# Patient Record
Sex: Female | Born: 2000 | Race: Black or African American | Hispanic: No | Smoking: Never smoker
Health system: Southern US, Community
[De-identification: ages and names within clinical notes are randomized; demographics above are authoritative.]

## PROBLEM LIST (undated history)

## (undated) DIAGNOSIS — J45909 Unspecified asthma, uncomplicated: Secondary | ICD-10-CM

## (undated) DIAGNOSIS — N390 Urinary tract infection, site not specified: Secondary | ICD-10-CM

## (undated) DIAGNOSIS — A749 Chlamydial infection, unspecified: Secondary | ICD-10-CM

## (undated) DIAGNOSIS — L80 Vitiligo: Secondary | ICD-10-CM

## (undated) HISTORY — PX: NO PAST SURGERIES: SHX2092

## (undated) HISTORY — PX: WISDOM TOOTH EXTRACTION: SHX21

## (undated) HISTORY — DX: Chlamydial infection, unspecified: A74.9

---
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
## 2019-02-08 ENCOUNTER — Telehealth: Payer: Self-pay

## 2019-02-08 NOTE — Telephone Encounter (Signed)
The patient stated she would like the birth control removed. The 3- year mark is 7/3. She stated she is experiencing issues with the birth control and does not want to complete another method at this time.

## 2019-02-28 ENCOUNTER — Ambulatory Visit: Payer: Medicaid Other | Admitting: Obstetrics & Gynecology

## 2019-03-24 ENCOUNTER — Ambulatory Visit: Payer: Medicaid Other | Admitting: Obstetrics & Gynecology

## 2019-03-24 ENCOUNTER — Other Ambulatory Visit: Payer: Self-pay

## 2019-03-24 VITALS — BP 120/75 | HR 78 | Temp 98.2°F | Wt 108.0 lb

## 2019-03-24 DIAGNOSIS — Z3046 Encounter for surveillance of implantable subdermal contraceptive: Secondary | ICD-10-CM

## 2019-03-24 DIAGNOSIS — Z30011 Encounter for initial prescription of contraceptive pills: Secondary | ICD-10-CM | POA: Diagnosis not present

## 2019-03-24 MED ORDER — NORGESTIM-ETH ESTRAD TRIPHASIC 0.18/0.215/0.25 MG-35 MCG PO TABS: 1.0000 | ORAL_TABLET | Freq: Every day | ORAL | 11 refills | Status: DC

## 2019-03-24 NOTE — Progress Notes (Signed)
GYNECOLOGY OFFICE PROCEDURE NOTE Rachael Gilbert is a 18 y.o. G0P0000 here for Nexplanon removal.  No other gynecologic concerns. She is concerned that she lost weight during the years the device was in place   Nexplanon Removal Patient identified, informed consent performed, consent signed.   Appropriate time out taken. Nexplanon site identified.  Area prepped in usual sterile fashon. One ml of 1% lidocaine was used to anesthetize the area at the distal end of the implant. A small stab incision was made right beside the implant on the distal portion.  The Nexplanon rod was grasped using hemostats and removed without difficulty.  There was minimal blood loss. There were no complications.  3 ml of 1% lidocaine was injected around the incision for post-procedure analgesia.  Steri-strips were applied over the small incision.  A pressure bandage was applied to reduce any bruising.  The patient tolerated the procedure well and was given post procedure instructions.  Patient is planning to use OCP for contraception.  Tri Sprintec rx sent to Rx       Woodroe Mode, MD Attending Obstetrician & Gynecologist, [place] [facility]  03/24/2019

## 2019-03-24 NOTE — Patient Instructions (Signed)

## 2019-03-29 ENCOUNTER — Encounter: Payer: Self-pay | Admitting: *Deleted

## 2019-08-14 ENCOUNTER — Other Ambulatory Visit: Payer: Self-pay

## 2019-08-14 ENCOUNTER — Inpatient Hospital Stay (HOSPITAL_COMMUNITY)
Admission: AD | Admit: 2019-08-14 | Discharge: 2019-08-14 | Disposition: A | Discharge status: Home / Self Care | Payer: Medicaid Other | Attending: Family Medicine | Admitting: Family Medicine

## 2019-08-14 ENCOUNTER — Encounter (HOSPITAL_COMMUNITY): Payer: Self-pay | Admitting: Family Medicine

## 2019-08-14 ENCOUNTER — Inpatient Hospital Stay (HOSPITAL_COMMUNITY): Payer: Medicaid Other

## 2019-08-14 DIAGNOSIS — B9689 Other specified bacterial agents as the cause of diseases classified elsewhere: Secondary | ICD-10-CM | POA: Diagnosis not present

## 2019-08-14 DIAGNOSIS — J45909 Unspecified asthma, uncomplicated: Secondary | ICD-10-CM | POA: Insufficient documentation

## 2019-08-14 DIAGNOSIS — N760 Acute vaginitis: Secondary | ICD-10-CM | POA: Diagnosis not present

## 2019-08-14 DIAGNOSIS — O99511 Diseases of the respiratory system complicating pregnancy, first trimester: Secondary | ICD-10-CM | POA: Insufficient documentation

## 2019-08-14 DIAGNOSIS — Z3A01 Less than 8 weeks gestation of pregnancy: Secondary | ICD-10-CM | POA: Diagnosis not present

## 2019-08-14 DIAGNOSIS — Z331 Pregnant state, incidental: Secondary | ICD-10-CM

## 2019-08-14 DIAGNOSIS — O26891 Other specified pregnancy related conditions, first trimester: Secondary | ICD-10-CM

## 2019-08-14 DIAGNOSIS — O98811 Other maternal infectious and parasitic diseases complicating pregnancy, first trimester: Secondary | ICD-10-CM | POA: Insufficient documentation

## 2019-08-14 DIAGNOSIS — O210 Mild hyperemesis gravidarum: Secondary | ICD-10-CM | POA: Diagnosis present

## 2019-08-14 LAB — POCT PREGNANCY, URINE: Preg Test, Ur: POSITIVE — AB

## 2019-08-14 LAB — CBC
HCT: 37.2 % (ref 36.0–46.0)
Hemoglobin: 12.8 g/dL (ref 12.0–15.0)
MCH: 30.1 pg (ref 26.0–34.0)
MCHC: 34.4 g/dL (ref 30.0–36.0)
MCV: 87.5 fL (ref 80.0–100.0)
Platelets: 297 10*3/uL (ref 150–400)
RBC: 4.25 MIL/uL (ref 3.87–5.11)
RDW: 12.3 % (ref 11.5–15.5)
WBC: 7.9 10*3/uL (ref 4.0–10.5)
nRBC: 0 % (ref 0.0–0.2)

## 2019-08-14 LAB — URINALYSIS, ROUTINE W REFLEX MICROSCOPIC
Bilirubin Urine: NEGATIVE
Glucose, UA: NEGATIVE mg/dL
Hgb urine dipstick: NEGATIVE
Ketones, ur: NEGATIVE mg/dL
Nitrite: NEGATIVE
Protein, ur: NEGATIVE mg/dL
Specific Gravity, Urine: 1.029 (ref 1.005–1.030)
pH: 6 (ref 5.0–8.0)

## 2019-08-14 LAB — WET PREP, GENITAL
Trich, Wet Prep: NONE SEEN
Yeast Wet Prep HPF POC: NONE SEEN

## 2019-08-14 LAB — HIV ANTIBODY (ROUTINE TESTING W REFLEX): HIV Screen 4th Generation wRfx: NONREACTIVE

## 2019-08-14 LAB — ABO/RH: ABO/RH(D): B POS

## 2019-08-14 LAB — HCG, QUANTITATIVE, PREGNANCY: hCG, Beta Chain, Quant, S: 6357 m[IU]/mL — ABNORMAL HIGH (ref <5)

## 2019-08-14 MED ORDER — ACETAMINOPHEN 500 MG PO TABS
1000.0000 mg | ORAL_TABLET | Freq: Once | ORAL | Status: AC
  Administered 2019-08-14: 1000 mg via ORAL
  Filled 2019-08-14: qty 2

## 2019-08-14 MED ORDER — METRONIDAZOLE 500 MG PO TABS: 500.0000 mg | ORAL_TABLET | Freq: Two times a day (BID) | ORAL | 0 refills | Status: AC

## 2019-08-14 MED ORDER — METOCLOPRAMIDE HCL 10 MG PO TABS: 10.0000 mg | ORAL_TABLET | Freq: Four times a day (QID) | ORAL | 0 refills | Status: DC

## 2019-08-14 NOTE — ED Notes (Signed)
Report called to MAU, pt states she is pregnant with pelvic pain.

## 2019-08-14 NOTE — MAU Note (Signed)
Pt reports constant lower abdominal pain that started a few days ago. Took ibuprofen the other day and took Tylenol earlier today, both which helped. Had +upt on Christmas Day. Pt denies bleeding or discharge. LMP: around Thanksgiving.

## 2019-08-14 NOTE — Discharge Instructions (Signed)

## 2019-08-14 NOTE — MAU Provider Note (Signed)
History     CSN: [id number]  Arrival date and time: 08/14/19 4010   First Provider Initiated Contact with Patient 08/14/19 2047      Chief Complaint  Patient presents with  . Possible Pregnancy  . Abdominal Pain   HPI  Ms.  ADRENA Gilbert is a 18 y.o. year old G22P0000 female at [redacted]w[redacted]d weeks gestation who presents to MAU reporting lower abdominal pain for the past few days. She has taken Ibuprofen a few days ago and Tylenol earlier today; both helped. She reports her LMP "around Thanksgiving" and a (+) UPT on 08/12/2019.  Past Medical History:  Diagnosis Date  . Asthma   . UTI (urinary tract infection)   . Vitiligo     Past Surgical History:  Procedure Laterality Date  . WISDOM TOOTH EXTRACTION      Family History  Problem Relation Age of Onset  . Bipolar disorder Mother   . Heart disease Mother   . Hypertension Mother   . Bipolar disorder Father     Social History   Tobacco Use  . Smoking status: Never Smoker  . Smokeless tobacco: Never Used  Substance Use Topics  . Alcohol use: No  . Drug use: Not Currently    Types: Marijuana    Comment: pt stated last use was on 06/2019    Allergies: No Known Allergies  No medications prior to admission.    Review of Systems  Constitutional: Negative.   HENT: Negative.   Eyes: Negative.   Respiratory: Negative.   Cardiovascular: Negative.   Gastrointestinal: Positive for nausea.  Endocrine: Negative.   Genitourinary: Positive for pelvic pain (constant for past few days).  Musculoskeletal: Negative.   Skin: Negative.   Allergic/Immunologic: Negative.   Neurological: Negative.   Hematological: Negative.   Psychiatric/Behavioral: Negative.    Physical Exam   Blood pressure 124/69, pulse 100, temperature 98.3 F (36.8 C), temperature source Oral, resp. rate 16, last menstrual period 07/14/2019, SpO2 99 %.  Physical Exam  Nursing note and vitals reviewed. Constitutional: She is oriented to person, place,  and time. She appears well-developed and well-nourished.  HENT:  Head: Normocephalic and atraumatic.  Eyes: Pupils are equal, round, and reactive to light.  Cardiovascular: Normal rate.  Respiratory: Effort normal.  GI: Soft.  Genitourinary:    Genitourinary Comments: Uterus: non-tender, SE: cervix is smooth, pink, no lesions, small amt of thick, white, malodorous vaginal d/c -- WP, GC/CT done, closed/long/firm, no CMT or friability, mild RT adnexal tenderness    Musculoskeletal:        General: Normal range of motion.     Cervical back: Normal range of motion.  Neurological: She is alert and oriented to person, place, and time.  Skin: Skin is warm and dry.  Psychiatric: She has a normal mood and affect. Her behavior is normal. Judgment and thought content normal.    MAU Course  Procedures  MDM [facility] UPT CBC ABO/Rh HCG Wet Prep GC/CT -- pending HIV -- pending OB < 14 wks [place] with TV Tylenol 1000 mg po --- resolved pain  Results for orders placed or performed during the hospital encounter of 08/14/19 (from the past 24 hour(s))  Pregnancy, urine POC     Status: Abnormal   Collection Time: 08/14/19  8:17 PM  Result Value Ref Range   Preg Test, Ur POSITIVE (A) NEGATIVE  Urinalysis, Routine w reflex microscopic     Status: Abnormal   Collection Time: 08/14/19  8:18 PM  Result Value  Ref Range   Color, Urine YELLOW YELLOW   APPearance HAZY (A) CLEAR   Specific Gravity, Urine 1.029 1.005 - 1.030   pH 6.0 5.0 - 8.0   Glucose, UA NEGATIVE NEGATIVE mg/dL   Hgb urine dipstick NEGATIVE NEGATIVE   Bilirubin Urine NEGATIVE NEGATIVE   Ketones, ur NEGATIVE NEGATIVE mg/dL   Protein, ur NEGATIVE NEGATIVE mg/dL   Nitrite NEGATIVE NEGATIVE   Leukocytes,Ua SMALL (A) NEGATIVE   RBC / HPF 0-5 0 - 5 RBC/hpf   WBC, UA 6-10 0 - 5 WBC/hpf   Bacteria, UA RARE (A) NONE SEEN   Squamous Epithelial / LPF 0-5 0 - 5   Mucus PRESENT   Wet prep, genital     Status: Abnormal   Collection Time:  08/14/19  8:58 PM  Result Value Ref Range   Yeast Wet Prep HPF POC NONE SEEN NONE SEEN   Trich, Wet Prep NONE SEEN NONE SEEN   Clue Cells Wet Prep HPF POC PRESENT (A) NONE SEEN   WBC, Wet Prep HPF POC MANY (A) NONE SEEN   Sperm PRESENT   CBC     Status: None   Collection Time: 08/14/19  9:02 PM  Result Value Ref Range   WBC 7.9 4.0 - 10.5 K/uL   RBC 4.25 3.87 - 5.11 MIL/uL   Hemoglobin 12.8 12.0 - 15.0 g/dL   HCT 60.1 09.3 - 23.5 %   MCV 87.5 80.0 - 100.0 fL   MCH 30.1 26.0 - 34.0 pg   MCHC 34.4 30.0 - 36.0 g/dL   RDW 57.3 22.0 - 25.4 %   Platelets 297 150 - 400 K/uL   nRBC 0.0 0.0 - 0.2 %  ABO/Rh     Status: None   Collection Time: 08/14/19  9:02 PM  Result Value Ref Range   ABO/RH(D) B POS    No rh immune globuloin      NOT A RH IMMUNE GLOBULIN CANDIDATE, PT RH POSITIVE Performed at [facility], [address] [place], [place] [address]   hCG, quantitative, pregnancy     Status: Abnormal   Collection Time: 08/14/19  9:02 PM  Result Value Ref Range   hCG, Beta Chain, Quant, S 6,357 (H) <5 mIU/mL    US OB LESS THAN 14 WEEKS WITH OB TRANSVAGINAL  Result Date: 08/14/2019 CLINICAL DATA:  18 year old pregnant female with pelvic pain. LMP: 07/14/2019 corresponding to an estimated gestational age of [redacted] weeks, 3 days. EXAM: OBSTETRIC <14 WK [place] AND TRANSVAGINAL OB US TECHNIQUE: Both transabdominal and transvaginal ultrasound examinations were performed for complete evaluation of the gestation as well as the maternal uterus, adnexal regions, and pelvic cul-de-sac. Transvaginal technique was performed to assess early pregnancy. COMPARISON:  None. FINDINGS: Intrauterine gestational sac: Single intrauterine gestational sac. Yolk sac:  Seen Embryo:  Not present. Cardiac Activity: N/A Heart Rate: N/A bpm MSD: 6 mm   5 w   2 d Subchorionic hemorrhage:  None visualized. Maternal uterus/adnexae: The maternal ovaries are unremarkable. There is a corpus luteum in the right ovary. IMPRESSION:  Single intrauterine gestational sac with an estimated gestational age of [redacted] weeks, 2 days. No fetal pole identified at this time. Follow-up with ultrasound in 7-11 days, or earlier if clinically indicated, recommended. Electronically Signed   By: Elgie Collard M.D.   On: 08/14/2019 21:36     Assessment and Plan  IUP (intrauterine pregnancy), incidental  - Advised to start [facility] - Information for [facility] given per request - [facility]   Abdominal pain during pregnancy in first trimester  - Information provided on abdominal pain in pregnancy - Advised to take Tylenol  - Safe medications in pregnancy list given   Bacterial vaginosis  - Information provided on BV - Rx for Flagyl 500 mg BID x 7 days - Discussed that BV can cause lower abdominal pain/cramping in pregnancy   Morning sickness  - Rx for Reglan 10 mg po every 6 hours prn N/V - Information provided on morning sickness   - Discharge patient - Message sent to [facility] admin pool to get patient scheduled for [facility] visits - Patient verbalized an understanding of the plan of care and agrees.     Raelyn Moraolitta Theodis Kinsel, MSN, CNM 08/14/2019, 8:47 PM

## 2019-08-15 LAB — GC/CHLAMYDIA PROBE AMP (~~LOC~~) NOT AT ARMC
Chlamydia: POSITIVE — AB
Comment: NEGATIVE
Comment: NORMAL
Neisseria Gonorrhea: NEGATIVE

## 2019-08-18 ENCOUNTER — Other Ambulatory Visit: Payer: Self-pay

## 2019-08-18 ENCOUNTER — Inpatient Hospital Stay (HOSPITAL_COMMUNITY)
Admission: AD | Admit: 2019-08-18 | Discharge: 2019-08-18 | Disposition: A | Discharge status: Home / Self Care | Payer: Medicaid Other | Attending: Obstetrics & Gynecology | Admitting: Obstetrics & Gynecology

## 2019-08-18 DIAGNOSIS — A568 Sexually transmitted chlamydial infection of other sites: Secondary | ICD-10-CM

## 2019-08-18 DIAGNOSIS — Z3A01 Less than 8 weeks gestation of pregnancy: Secondary | ICD-10-CM

## 2019-08-18 DIAGNOSIS — O98311 Other infections with a predominantly sexual mode of transmission complicating pregnancy, first trimester: Secondary | ICD-10-CM

## 2019-08-18 MED ORDER — AZITHROMYCIN 500 MG PO TABS: 1000.0000 mg | ORAL_TABLET | Freq: Once | ORAL | 0 refills | Status: AC

## 2019-08-18 MED ORDER — AZITHROMYCIN 500 MG PO TABS: 1000.0000 mg | ORAL_TABLET | Freq: Once | ORAL | 0 refills | Status: DC

## 2019-08-18 NOTE — Discharge Instructions (Signed)
Expedited Partner Therapy:  °Information Sheet for Patients and Partners  °            ° °You have been offered expedited partner therapy (EPT). This information sheet contains important information and warnings you need to be aware of, so please read it carefully.  ° °Expedited Partner Therapy (EPT) is the clinical practice of treating the sexual partners of persons who receive chlamydia, gonorrhea, or trichomoniasis diagnoses by providing medications or prescriptions to the patient. Patients then provide partners with these therapies without the health-care provider having examined the partner. In other words, EPT is a convenient, fast and private way for patients to help their sexual partners get treated.  ° °Chlamydia and gonorrhea are bacterial infections you get from having sex with a person who is already infected. Trichomoniasis (or “trich”) is a very common sexually transmitted infection (STI) that is caused by infection with a protozoan parasite called Trichomonas vaginalis.  Many people with these infections don’t know it because they feel fine, but without treatment these infections can cause serious health problems, such as pelvic inflammatory disease, ectopic pregnancy, infertility and increased risk of HIV.  ° °It is important to get treated as soon as possible to protect your health, to avoid spreading these infections to others, and to prevent yourself from becoming re-infected. The good news is these infections can be easily cured with proper antibiotic medicine. The best way to take care of your self is to see a doctor or go to your local health department. If you are not able to see a doctor or other medical provider, you should take EPT.  ° ° °Recommended Medication: °EPT for Chlamydia:  Azithromycin (Zithromax) 1 gram orally in a single dose °EPT for Gonorrhea:  Cefixime (Suprax) 400 milligrams orally in a single dose PLUS azithromycin (Zithromax) 1 gram orally in a single dose °EPT for  Trichomoniasis:  Metronidazole (Flagyl) 2 grams orally in a single dose ° ° °These medicines are very safe. However, you should not take them if you have ever had an allergic reaction (like a rash) to any of these medicines: azithromycin (Zithromax), erythromycin, clarithromycin (Biaxin), metronidazole (Flagyl), tinidazole (Tindimax). If you are uncertain about whether you have an allergy, call your medical provider or pharmacist before taking this medicine. If you have a serious, long-term illness like kidney, liver or heart disease, colitis or stomach problems, or you are currently taking other prescription medication, talk to your provider before taking this medication.  ° °Women: If you have lower belly pain, pain during sex, vomiting, or a fever, do not take this medicine. Instead, you should see a medical provider to be certain you do not have pelvic inflammatory disease (PID). PID can be serious and lead to infertility, pregnancy problems or chronic pelvic pain.  ° °Pregnant Women: It is very important for you to see a doctor to get pregnancy services and pre-natal care. These antibiotics for EPT are safe for pregnant women, but you still need to see a medical provider as soon as possible. It is also important to note that Doxycycline is an alternative therapy for chlamydia, but it should not be taken by someone who is pregnant.  ° °Men: If you have pain or swelling in the testicles or a fever, do not take this medicine and see a medical provider.    ° °Men who have sex with men (MSM): MSM in [place] continue to experience high rates of syphilis and HIV. Many MSM with gonorrhea or   chlamydia could also have syphilis and/or HIV and not know it. If you are a man who has sex with other men, it is very important that you see a medical provider and are tested for HIV and syphilis. EPT is not recommended for gonorrhea for MSM.  Recommended treatment for gonorrhea for MSM is Rocephin (shot) AND azithromycin  due to decreased cure rate.  Please see your medical provider if this is the case.   ° °Along with this information sheet is a prescription for the medicine. If you receive a prescription it will be in your name and will indicate your date of birth, or it will be in the name of “Expedited Partner Therapy”.   In either case, you can have the prescription filled at a pharmacy. You will be responsible for the cost of the medicine, unless you have prescription drug coverage. In that case, you could provide your name so the pharmacy could bill your health plan.  ° °Take the medication as directed. Some people will have a mild, upset stomach, which does not last long. AVOID alcohol 24 hours after taking metronidazole (Flagyl) to reduce the possibility of a disulfiram-like reaction (severe vomiting and abdominal pain).  After taking the medicine, do not have sex for 7 days. Do not share this medicine or give it to anyone else. It is important to tell everyone you have had sex with in the last 60 days that they need to go and get tested for sexually transmitted infections.  ° °Ways to prevent these and other sexually transmitted infections (STIs):  ° °• Abstain from sex. This is the only sure way to avoid getting an STI.  °• Use barrier methods, such as condoms, consistently and correctly.  °• Limit the number of sexual partners.  °• Have regular physical exams, including testing for STIs.  ° °For more information about EPT or other issues pertaining to an STI, please contact your medical provider or the [place] [facility] at [phone] or [email or web address]   ° °

## 2019-08-18 NOTE — MAU Provider Note (Signed)
Chief Complaint: need rx/treatment   First Provider Initiated Contact with Patient 08/18/19 1418      SUBJECTIVE HPI: Rachael Gilbert is a 18 y.o. G1P0000 at [redacted]w[redacted]d by early ultrasound who presents to maternity admissions reporting she received a phone call saying she has chlamydia. She presented for abdominal cramping on 08/14/19 but reports she feels better today.  There are no other symptoms. She has not tried any treatments.   HPI  Past Medical History:  Diagnosis Date  . Asthma   . UTI (urinary tract infection)   . Vitiligo    Past Surgical History:  Procedure Laterality Date  . WISDOM TOOTH EXTRACTION     Social History   Socioeconomic History  . Marital status: Single    Spouse name: Not on file  . Number of children: Not on file  . Years of education: Not on file  . Highest education level: Not on file  Occupational History  . Not on file  Tobacco Use  . Smoking status: Never Smoker  . Smokeless tobacco: Never Used  Substance and Sexual Activity  . Alcohol use: No  . Drug use: Not Currently    Types: Marijuana    Comment: pt stated last use was on 06/2019  . Sexual activity: Yes    Birth control/protection: None  Other Topics Concern  . Not on file  Social History Narrative  . Not on file   Social Determinants of Health   Financial Resource Strain:   . Difficulty of Paying Living Expenses: Not on file  Food Insecurity:   . Worried About Programme researcher, broadcasting/film/video in the Last Year: Not on file  . Ran Out of Food in the Last Year: Not on file  Transportation Needs:   . Lack of Transportation (Medical): Not on file  . Lack of Transportation (Non-Medical): Not on file  Physical Activity:   . Days of Exercise per Week: Not on file  . Minutes of Exercise per Session: Not on file  Stress:   . Feeling of Stress : Not on file  Social Connections:   . Frequency of Communication with Friends and Family: Not on file  . Frequency of Social Gatherings with Friends and  Family: Not on file  . Attends Religious Services: Not on file  . Active Member of Clubs or Organizations: Not on file  . Attends Banker Meetings: Not on file  . Marital Status: Not on file  Intimate Partner Violence:   . Fear of Current or Ex-Partner: Not on file  . Emotionally Abused: Not on file  . Physically Abused: Not on file  . Sexually Abused: Not on file   No current facility-administered medications on file prior to encounter.   Current Outpatient Medications on File Prior to Encounter  Medication Sig Dispense Refill  . acetaminophen (TYLENOL) 500 MG tablet Take 500 mg by mouth every 6 (six) hours as needed.    Marland Kitchen erythromycin ophthalmic ointment Place a 1/2 inch ribbon of ointment into the left  lower eyelid tid x 4 days (Patient not taking: Reported on 03/24/2019) 1 g 0  . fexofenadine (ALLEGRA) 180 MG tablet Take 180 mg by mouth daily.    . metoCLOPramide (REGLAN) 10 MG tablet Take 1 tablet (10 mg total) by mouth every 6 (six) hours. 120 tablet 0  . metroNIDAZOLE (FLAGYL) 500 MG tablet Take 1 tablet (500 mg total) by mouth 2 (two) times daily for 7 days. 14 tablet 0  . triamcinolone  cream (KENALOG) 0.1 % Apply 1 application topically 2 (two) times daily. (Patient not taking: Reported on 03/24/2019) 80 g 0   No Known Allergies  ROS:  Review of Systems  Constitutional: Negative for chills, fatigue and fever.  Respiratory: Negative for shortness of breath.   Cardiovascular: Negative for chest pain.  Gastrointestinal: Negative for nausea and vomiting.  Genitourinary: Negative for difficulty urinating, dysuria, flank pain, pelvic pain, vaginal bleeding, vaginal discharge and vaginal pain.  Neurological: Negative for dizziness and headaches.  Psychiatric/Behavioral: Negative.      I have reviewed patient's Past Medical Hx, Surgical Hx, Family Hx, Social Hx, medications and allergies.   Physical Exam  No data found. Constitutional: Well-developed, well-nourished  female in no acute distress.  Cardiovascular: normal rate Respiratory: normal effort GI: Abd soft, non-tender. Pos BS x 4 MS: Extremities nontender, no edema, normal ROM Neurologic: Alert and oriented x 4.  GU: Neg CVAT.    LAB RESULTS No results found for this or any previous visit (from the past 24 hour(s)).  --/--/B POS (12/27 2102)  IMAGING  MAU Management/MDM: Orders Placed This Encounter  Procedures  . Discharge patient    Meds ordered this encounter  Medications  . azithromycin (ZITHROMAX) 500 MG tablet    Sig: Take 2 tablets (1,000 mg total) by mouth once for 1 dose.    Dispense:  2 tablet    Refill:  0    Order Specific Question:   Supervising Provider    Answer:   Woodroe Mode [[id number]]    Pt without symptoms today but presented to MAU for treatment.  Offered treatment in MAU vs Rx for pt to pick up. Pt selects Rx.  Azithromycin 1000 mg PO x 1 dose sent to pt preferred pharmacy.  Expedited partner therapy with azithromycin 1000 mg PO x 1 dose written for one female partner, Rachael Gilbert. Pt will need TOC at NOB visit.  Pt discharged with strict return precautions.  ASSESSMENT 1. Chlamydia trachomatis infection in mother during first trimester of pregnancy     PLAN Discharge home Allergies as of 08/18/2019   No Known Allergies     Medication List    TAKE these medications   acetaminophen 500 MG tablet Commonly known as: TYLENOL Take 500 mg by mouth every 6 (six) hours as needed.   azithromycin 500 MG tablet Commonly known as: Zithromax Take 2 tablets (1,000 mg total) by mouth once for 1 dose.   erythromycin ophthalmic ointment Place a 1/2 inch ribbon of ointment into the left  lower eyelid tid x 4 days   fexofenadine 180 MG tablet Commonly known as: ALLEGRA Take 180 mg by mouth daily.   metoCLOPramide 10 MG tablet Commonly known as: REGLAN Take 1 tablet (10 mg total) by mouth every 6 (six) hours.   metroNIDAZOLE 500 MG tablet Commonly known as:  FLAGYL Take 1 tablet (500 mg total) by mouth 2 (two) times daily for 7 days.   triamcinolone cream 0.1 % Commonly known as: KENALOG Apply 1 application topically 2 (two) times daily.      Follow-up [place] for [facility] Follow up.   Specialty: Obstetrics and Gynecology Why: On 09/27/19 as scheduled for new OB visit. Return to MAU with any emergencies  Contact information: [address], [id number] [place] [id number] [place] Certified Nurse-Midwife 08/18/2019  2:27 PM

## 2019-08-18 NOTE — MAU Note (Signed)
Pt had rec'd call regarding +Chlamydia results. CNM in and discussed test results and treatment options. Pt getting prescription for self and partner.

## 2019-08-19 NOTE — L&D Delivery Note (Signed)
Delivery Note At 11:11 PM a viable female was delivered via Vaginal, Spontaneous (Presentation: Right Occiput Anterior).  APGAR: 2, 7; weight  .   Placenta status: Spontaneous, Intact.  Cord: 3 vessels with the following complications: Nuchal cord x1.  Cord pH: N/A  Anesthesia: Epidural Episiotomy: None Lacerations: 1st degree;Periurethral Suture Repair: 3.0 Est. Blood Loss (mL): 100  Mom to postpartum.  Baby to Couplet care / Skin to Skin.  Sabino Dick 04/11/2020, 11:34 PM

## 2019-08-24 ENCOUNTER — Inpatient Hospital Stay (HOSPITAL_COMMUNITY): Payer: Medicaid Other

## 2019-08-24 ENCOUNTER — Other Ambulatory Visit: Payer: Self-pay

## 2019-08-24 ENCOUNTER — Encounter (HOSPITAL_COMMUNITY): Payer: Self-pay | Admitting: Obstetrics & Gynecology

## 2019-08-24 ENCOUNTER — Inpatient Hospital Stay (HOSPITAL_COMMUNITY)
Admission: AD | Admit: 2019-08-24 | Discharge: 2019-08-25 | Disposition: A | Discharge status: Home / Self Care | Payer: Medicaid Other | Source: Ambulatory Visit | Attending: Obstetrics & Gynecology | Admitting: Obstetrics & Gynecology

## 2019-08-24 DIAGNOSIS — Z3A01 Less than 8 weeks gestation of pregnancy: Secondary | ICD-10-CM | POA: Diagnosis not present

## 2019-08-24 DIAGNOSIS — O3680X0 Pregnancy with inconclusive fetal viability, not applicable or unspecified: Secondary | ICD-10-CM

## 2019-08-24 DIAGNOSIS — Z79899 Other long term (current) drug therapy: Secondary | ICD-10-CM | POA: Diagnosis not present

## 2019-08-24 DIAGNOSIS — R102 Pelvic and perineal pain: Secondary | ICD-10-CM | POA: Diagnosis not present

## 2019-08-24 DIAGNOSIS — O26891 Other specified pregnancy related conditions, first trimester: Secondary | ICD-10-CM | POA: Diagnosis not present

## 2019-08-24 DIAGNOSIS — O26899 Other specified pregnancy related conditions, unspecified trimester: Secondary | ICD-10-CM

## 2019-08-24 DIAGNOSIS — K0889 Other specified disorders of teeth and supporting structures: Secondary | ICD-10-CM

## 2019-08-24 NOTE — MAU Provider Note (Signed)
Chief Complaint: Abdominal Pain and Dental Pain   None       SUBJECTIVE HPI: Rachael Gilbert is a 19 y.o. G1P0000 at [redacted]w[redacted]d by LMP who presents to maternity admissions reporting intermittent sharp pelvic pains with a painful tooth.  Has a dental appointment next week. . She denies vaginal bleeding, vaginal itching/burning, urinary symptoms, h/a, dizziness, n/v, or fever/chills.     Abdominal Pain This is a new problem. The current episode started in the past 7 days. The onset quality is gradual. The problem occurs intermittently. The problem has been unchanged. The quality of the pain is cramping. The abdominal pain does not radiate. Pertinent negatives include no constipation, diarrhea, dysuria, fever, frequency, headaches, myalgias, nausea or vomiting. Nothing aggravates the pain. The pain is relieved by nothing. She has tried nothing for the symptoms.  Dental Pain  This is a new problem. The current episode started in the past 7 days. The problem occurs constantly. The problem has been unchanged. The pain is moderate. Pertinent negatives include no difficulty swallowing, facial pain, fever, oral bleeding or sinus pressure. She has tried nothing for the symptoms.    RN Note: States she is having sharp lower abdominal pains that occur intermittently.  Most in the lower, central part of her abdomen.  Has not taken anything for the pain. Denies VB.  Also states she is having tooth pain and has contacted a dentist-appt made for 1/14  Past Medical History:  Diagnosis Date  . Asthma   . UTI (urinary tract infection)   . Vitiligo    Past Surgical History:  Procedure Laterality Date  . WISDOM TOOTH EXTRACTION     Social History   Socioeconomic History  . Marital status: Single    Spouse name: Not on file  . Number of children: Not on file  . Years of education: Not on file  . Highest education level: Not on file  Occupational History  . Not on file  Tobacco Use  . Smoking status: Never  Smoker  . Smokeless tobacco: Never Used  Substance and Sexual Activity  . Alcohol use: No  . Drug use: Not Currently    Types: Marijuana    Comment: pt stated last use was on 06/2019  . Sexual activity: Yes    Birth control/protection: None  Other Topics Concern  . Not on file  Social History Narrative  . Not on file   Social Determinants of Health   Financial Resource Strain:   . Difficulty of Paying Living Expenses: Not on file  Food Insecurity:   . Worried About Charity fundraiser in the Last Year: Not on file  . Ran Out of Food in the Last Year: Not on file  Transportation Needs:   . Lack of Transportation (Medical): Not on file  . Lack of Transportation (Non-Medical): Not on file  Physical Activity:   . Days of Exercise per Week: Not on file  . Minutes of Exercise per Session: Not on file  Stress:   . Feeling of Stress : Not on file  Social Connections:   . Frequency of Communication with Friends and Family: Not on file  . Frequency of Social Gatherings with Friends and Family: Not on file  . Attends Religious Services: Not on file  . Active Member of Clubs or Organizations: Not on file  . Attends Archivist Meetings: Not on file  . Marital Status: Not on file  Intimate Partner Violence:   . Fear  of Current or Ex-Partner: Not on file  . Emotionally Abused: Not on file  . Physically Abused: Not on file  . Sexually Abused: Not on file   No current facility-administered medications on file prior to encounter.   Current Outpatient Medications on File Prior to Encounter  Medication Sig Dispense Refill  . acetaminophen (TYLENOL) 500 MG tablet Take 500 mg by mouth every 6 (six) hours as needed.    Marland Kitchen erythromycin ophthalmic ointment Place a 1/2 inch ribbon of ointment into the left  lower eyelid tid x 4 days (Patient not taking: Reported on 03/24/2019) 1 g 0  . fexofenadine (ALLEGRA) 180 MG tablet Take 180 mg by mouth daily.    . metoCLOPramide (REGLAN) 10 MG  tablet Take 1 tablet (10 mg total) by mouth every 6 (six) hours. 120 tablet 0  . triamcinolone cream (KENALOG) 0.1 % Apply 1 application topically 2 (two) times daily. (Patient not taking: Reported on 03/24/2019) 80 g 0   No Known Allergies  I have reviewed patient's Past Medical Hx, Surgical Hx, Family Hx, Social Hx, medications and allergies.   ROS:  Review of Systems  Constitutional: Negative for fever.  HENT: Negative for sinus pressure.   Gastrointestinal: Positive for abdominal pain. Negative for constipation, diarrhea, nausea and vomiting.  Genitourinary: Negative for dysuria and frequency.  Musculoskeletal: Negative for myalgias.  Neurological: Negative for headaches.   Review of Systems  Other systems negative   Physical Exam  Physical Exam Patient Vitals for the past 24 hrs:  BP Temp Pulse Resp Weight  08/24/19 2310 123/74 98.7 F (37.1 C) 85 17 56.8 kg   Constitutional: Well-developed, well-nourished female in no acute distress.  Oral:  Right front molar tender but not erethema or swelling Cardiovascular: normal rate Respiratory: normal effort GI: Abd soft, non-tender. Pos BS x 4 MS: Extremities nontender, no edema, normal ROM Neurologic: Alert and oriented x 4.  GU: Neg CVAT.  PELVIC EXAM: deferred, cultures done by blind swab   LAB RESULTS No results found for this or any previous visit (from the past 24 hour(s)).  --/--/B POS (12/27 2102)  IMAGING US OB Transvaginal  Result Date: 08/25/2019 CLINICAL DATA:  19 year old pregnant female with pelvic pain. LMP: 07/14/2019 corresponding to an estimated gestational age of [redacted] weeks, 6 days EXAM: TRANSVAGINAL OB ULTRASOUND TECHNIQUE: Transvaginal ultrasound was performed for complete evaluation of the gestation as well as the maternal uterus, adnexal regions, and pelvic cul-de-sac. COMPARISON:  Ultrasound dated 08/14/2019. FINDINGS: Intrauterine gestational sac: Single intrauterine gestational sac. Yolk sac:  Seen  Embryo:  Present Cardiac Activity: Detected Heart Rate: 130 bpm CRL: 9 mm   6 w 5 d                  [place] EDC: 04/13/2020 Subchorionic hemorrhage:  None visualized. Maternal uterus/adnexae: The maternal ovaries appear unremarkable. A corpus luteum is noted in the right ovary. IMPRESSION: Single live intrauterine pregnancy with an estimated gestational age of [redacted] weeks, 5 days concordant with age based on the prior ultrasound of 08/14/2019. Electronically Signed   By: Elgie Collard M.D.   On: 08/25/2019 00:01     MAU Management/MDM: Ordered usual first trimester r/o ectopic labs.   cultures done Will check baseline Ultrasound to rule out ectopic.  This pain can represent a normal pregnancy with bleeding, spontaneous abortion or even an ectopic which can be life-threatening.  The process as listed above helps to determine which of these is present.  Discussed [place]  results.   Discussed tooth pain.  No signs of abscess  May use Tylenol for pain and add Tramadol for severe pain.  ASSESSMENT 1. Pregnancy of unknown anatomic location   2. Pelvic pain affecting pregnancy   3.     Tooth pain  PLAN Discharge home  Rx Tramadol for severe tooth pain Encouraged to seek prenatal care soon  Pt stable at time of discharge. Encouraged to return here or to other Urgent Care/ED if she develops worsening of symptoms, increase in pain, fever, or other concerning symptoms.    Wynelle Bourgeois CNM, MSN Certified Nurse-Midwife 08/24/2019  11:18 PM

## 2019-08-24 NOTE — MAU Note (Signed)
States she is having sharp lower abdominal pains that occur intermittently.  Most in the lower, central part of her abdomen.  Has not taken anything for the pain. Denies VB.  Also states she is having tooth pain and has contacted a dentist-appt made for 1/14.

## 2019-08-25 LAB — URINALYSIS, ROUTINE W REFLEX MICROSCOPIC
Bilirubin Urine: NEGATIVE
Glucose, UA: NEGATIVE mg/dL
Hgb urine dipstick: NEGATIVE
Ketones, ur: NEGATIVE mg/dL
Nitrite: NEGATIVE
Protein, ur: NEGATIVE mg/dL
Specific Gravity, Urine: 1.008 (ref 1.005–1.030)
pH: 8 (ref 5.0–8.0)

## 2019-08-25 MED ORDER — TRAMADOL HCL 50 MG PO TABS: 50.0000 mg | ORAL_TABLET | Freq: Four times a day (QID) | ORAL | 0 refills | Status: DC | PRN

## 2019-08-25 NOTE — Discharge Instructions (Signed)
First Trimester of Pregnancy  The first trimester of pregnancy is from week 1 until the end of week 13 (months 1 through 3). During this time, your baby will begin to develop inside you. At 6-8 weeks, the eyes and face are formed, and the heartbeat can be seen on ultrasound. At the end of 12 weeks, all the baby's organs are formed. Prenatal care is all the medical care you receive before the birth of your baby. Make sure you get good prenatal care and follow all of your doctor's instructions. Follow these instructions at home: Medicines  Take over-the-counter and prescription medicines only as told by your doctor. Some medicines are safe and some medicines are not safe during pregnancy.  Take a prenatal vitamin that contains at least 600 micrograms (mcg) of folic acid.  If you have trouble pooping (constipation), take medicine that will make your stool soft (stool softener) if your doctor approves. Eating and drinking   Eat regular, healthy meals.  Your doctor will tell you the amount of weight gain that is right for you.  Avoid raw meat and uncooked cheese.  If you feel sick to your stomach (nauseous) or throw up (vomit): ? Eat 4 or 5 small meals a day instead of 3 large meals. ? Try eating a few soda crackers. ? Drink liquids between meals instead of during meals.  To prevent constipation: ? Eat foods that are high in fiber, like fresh fruits and vegetables, whole grains, and beans. ? Drink enough fluids to keep your pee (urine) clear or pale yellow. Activity  Exercise only as told by your doctor. Stop exercising if you have cramps or pain in your lower belly (abdomen) or low back.  Do not exercise if it is too hot, too humid, or if you are in a place of great height (high altitude).  Try to avoid standing for long periods of time. Move your legs often if you must stand in one place for a long time.  Avoid heavy lifting.  Wear low-heeled shoes. Sit and stand up  straight.  You can have sex unless your doctor tells you not to. Relieving pain and discomfort  Wear a good support bra if your breasts are sore.  Take warm water baths (sitz baths) to soothe pain or discomfort caused by hemorrhoids. Use hemorrhoid cream if your doctor says it is okay.  Rest with your legs raised if you have leg cramps or low back pain.  If you have puffy, bulging veins (varicose veins) in your legs: ? Wear support hose or compression stockings as told by your doctor. ? Raise (elevate) your feet for 15 minutes, 3-4 times a day. ? Limit salt in your food. Prenatal care  Schedule your prenatal visits by the twelfth week of pregnancy.  Write down your questions. Take them to your prenatal visits.  Keep all your prenatal visits as told by your doctor. This is important. Safety  Wear your seat belt at all times when driving.  Make a list of emergency phone numbers. The list should include numbers for family, friends, the hospital, and police and fire departments. General instructions  Ask your doctor for a referral to a local prenatal class. Begin classes no later than at the start of month 6 of your pregnancy.  Ask for help if you need counseling or if you need help with nutrition. Your doctor can give you advice or tell you where to go for help.  Do not use hot tubs, steam   rooms, or saunas.  Do not douche or use tampons or scented sanitary pads.  Do not cross your legs for long periods of time.  Avoid all herbs and alcohol. Avoid drugs that are not approved by your doctor.  Do not use any tobacco products, including cigarettes, chewing tobacco, and electronic cigarettes. If you need help quitting, ask your doctor. You may get counseling or other support to help you quit.  Avoid cat litter boxes and soil used by cats. These carry germs that can cause birth defects in the baby and can cause a loss of your baby (miscarriage) or stillbirth.  Visit your dentist.  At home, brush your teeth with a soft toothbrush. Be gentle when you floss. Contact a doctor if:  You are dizzy.  You have mild cramps or pressure in your lower belly.  You have a nagging pain in your belly area.  You continue to feel sick to your stomach, you throw up, or you have watery poop (diarrhea).  You have a bad smelling fluid coming from your vagina.  You have pain when you pee (urinate).  You have increased puffiness (swelling) in your face, hands, legs, or ankles. Get help right away if:  You have a fever.  You are leaking fluid from your vagina.  You have spotting or bleeding from your vagina.  You have very bad belly cramping or pain.  You gain or lose weight rapidly.  You throw up blood. It may look like coffee grounds.  You are around people who have [place] measles, fifth disease, or chickenpox.  You have a very bad headache.  You have shortness of breath.  You have any kind of trauma, such as from a fall or a car accident. Summary  The first trimester of pregnancy is from week 1 until the end of week 13 (months 1 through 3).  To take care of yourself and your unborn baby, you will need to eat healthy meals, take medicines only if your doctor tells you to do so, and do activities that are safe for you and your baby.  Keep all follow-up visits as told by your doctor. This is important as your doctor will have to ensure that your baby is healthy and growing well. This information is not intended to replace advice given to you by your health care provider. Make sure you discuss any questions you have with your health care provider. Document Revised: 11/25/2018 Document Reviewed: 08/12/2016 Elsevier Patient Education  [place] [employer]  Dental caries are spots of decay (cavities) in teeth. They are in the outer layer of your tooth (enamel). Treat them as soon as you can. If they are not treated, they can spread decay and lead to painful  infection. Follow these instructions at home: General instructions  Take good care of your mouth and teeth. This keeps them healthy. ? Brush your teeth 2 times a day. Use toothpaste with fluoride in it. ? Floss your teeth once a day.  If your dentist prescribed an antibiotic medicine to treat an infection, take it as told. Do not stop taking the antibiotic even if your condition gets better.  Keep all follow-up visits as told by your dentist. This is important. This includes all cleanings. Preventing dental caries   Brush your teeth every morning and night. Use fluoride toothpaste.  Get regular dental cleanings.  If you are at risk of dental caries. ? Wash your mouth with prescription mouthwash (chlorhexidine). ? Put topical fluoride  on your teeth.  Drink water with fluoride in it.  Drink water instead of sugary drinks.  Eat healthy meals and snacks. Contact a doctor if:  You have symptoms of tooth decay. Summary  Dental caries are spots of decay (cavities) in teeth. They are in the outer layer of your tooth.  Take an antibiotic to treat an infection, if told by your dentist. Do not stop taking the antibiotic even if your condition gets better.  Regular dental cleanings and brushing can help prevent dental caries. This information is not intended to replace advice given to you by your health care provider. Make sure you discuss any questions you have with your health care provider. Document Revised: 07/17/2017 Document Reviewed: 04/20/2016 Elsevier Patient Education  2020 [employer].

## 2019-09-27 ENCOUNTER — Other Ambulatory Visit: Payer: Self-pay

## 2019-09-27 ENCOUNTER — Ambulatory Visit (INDEPENDENT_AMBULATORY_CARE_PROVIDER_SITE_OTHER): Payer: Medicaid Other | Admitting: *Deleted

## 2019-09-27 DIAGNOSIS — Z3490 Encounter for supervision of normal pregnancy, unspecified, unspecified trimester: Secondary | ICD-10-CM

## 2019-09-27 DIAGNOSIS — A568 Sexually transmitted chlamydial infection of other sites: Secondary | ICD-10-CM

## 2019-09-27 DIAGNOSIS — O98311 Other infections with a predominantly sexual mode of transmission complicating pregnancy, first trimester: Secondary | ICD-10-CM

## 2019-09-27 MED ORDER — BLOOD PRESSURE KIT DEVI: 1.0000 | 0 refills | Status: DC | PRN

## 2019-09-27 MED ORDER — COMPLETENATE 29-1 MG PO CHEW: 1.0000 | CHEWABLE_TABLET | Freq: Every day | ORAL | 11 refills | Status: DC

## 2019-09-27 NOTE — Progress Notes (Signed)
I connected with  Rachael Gilbert on 09/27/19 at 10:30 AM EST by telephone and verified that I am speaking with the correct person using two identifiers.   I discussed the limitations, risks, security and privacy concerns of performing an evaluation and management service by telephone and the availability of in person appointments. I also discussed with the patient that there may be a patient responsible charge related to this service. The patient expressed understanding and agreed to proceed.  I explained I am completing her New OB Intake today. We discussed Her EDD and that it is based on  Early [place] which was 6 days different than LMP . I reviewed her allergies, meds, OB History, Medical /Surgical history, and appropriate screenings. I informed her of [facility] services.  I explained I will send her the Babys[employer]s app- app sent to her while on phone.  I explained we will send a blood pressure cuff to [facility] that will fill that prescription and we called [facility] to verify they received her prescription and confirmed she will pick  up the cuff.  I asked her to bring the blood pressure cuff with her to her first ob appointment so we can show her how to use it. Explained  then we will have her take her blood pressure weekly and enter into the app. I explained she will have some visits in office and some virtually. She already has Sports coach. I reviewed her new ob  appointment date/ time with her , our location and to wear mask, no visitors.  I explained she will have a pelvic exam, ob bloodwork, hemoglobin a1C, cbg , and  genetic testing if desired,- she does want a panorama. I scheduled an [place] at 19 weeks and gave her the appointment. She voices understanding.   Kodi Steil,RN 09/27/2019  10:33 AM

## 2019-09-27 NOTE — Patient Instructions (Signed)

## 2019-09-28 NOTE — Progress Notes (Signed)
Patient seen and assessed by nursing staff during this encounter. I have reviewed the chart and agree with the documentation and plan.  Thressa Sheller DNP, CNM  09/28/19  8:11 AM

## 2019-10-10 ENCOUNTER — Other Ambulatory Visit (HOSPITAL_COMMUNITY)
Admission: RE | Admit: 2019-10-10 | Discharge: 2019-10-10 | Disposition: A | Discharge status: Home / Self Care | Payer: Medicaid Other | Source: Ambulatory Visit | Attending: Advanced Practice Midwife | Admitting: Advanced Practice Midwife

## 2019-10-10 ENCOUNTER — Other Ambulatory Visit: Payer: Self-pay

## 2019-10-10 ENCOUNTER — Encounter: Payer: Self-pay | Admitting: Advanced Practice Midwife

## 2019-10-10 ENCOUNTER — Ambulatory Visit (INDEPENDENT_AMBULATORY_CARE_PROVIDER_SITE_OTHER): Payer: Medicaid Other | Admitting: Advanced Practice Midwife

## 2019-10-10 DIAGNOSIS — Z3492 Encounter for supervision of normal pregnancy, unspecified, second trimester: Secondary | ICD-10-CM | POA: Diagnosis not present

## 2019-10-10 DIAGNOSIS — Z3A13 13 weeks gestation of pregnancy: Secondary | ICD-10-CM

## 2019-10-10 DIAGNOSIS — Z3491 Encounter for supervision of normal pregnancy, unspecified, first trimester: Secondary | ICD-10-CM

## 2019-10-10 NOTE — Progress Notes (Signed)
Subjective:   Rachael Gilbert is a 19 y.o. G1P0000 at 60w3dby early ultrasound being seen today for her first obstetrical visit.  Her obstetrical history is significant for none . Patient does intend to breast feed. Pregnancy history fully reviewed.  Patient reports no complaints.  HISTORY: OB History  Gravida Para Term Preterm AB Living  1 0 0 0 0 0  SAB TAB Ectopic Multiple Live Births  0 0 0 0 0    # Outcome Date GA Lbr Len/2nd Weight Sex Delivery Anes PTL Lv  1 Current             Last pap smear was done NA and was NA age   Past Medical History:  Diagnosis Date  . Asthma   . Chlamydia infection   . UTI (urinary tract infection)   . Vitiligo    Past Surgical History:  Procedure Laterality Date  . NO PAST SURGERIES    . WISDOM TOOTH EXTRACTION     Family History  Problem Relation Age of Onset  . Bipolar disorder Mother   . Heart disease Mother   . Hypertension Mother   . Bipolar disorder Father    Social History   Tobacco Use  . Smoking status: Never Smoker  . Smokeless tobacco: Never Used  Substance Use Topics  . Alcohol use: No  . Drug use: Not Currently    Types: Marijuana    Comment: pt stated last use was on 06/2019   No Known Allergies Current Outpatient Medications on File Prior to Visit  Medication Sig Dispense Refill  . acetaminophen (TYLENOL) 500 MG tablet Take 500 mg by mouth every 6 (six) hours as needed.    . Blood Pressure Monitoring (BLOOD PRESSURE KIT) DEVI 1 Device by Does not apply route as needed. 1 each 0  . fexofenadine (ALLEGRA) 180 MG tablet Take 180 mg by mouth daily.    . metoCLOPramide (REGLAN) 10 MG tablet Take 10 mg by mouth 4 (four) times daily.    . prenatal vitamin w/FE, FA (NATACHEW) 29-1 MG CHEW chewable tablet Chew 1 tablet by mouth daily at 12 noon. 30 tablet 11  . erythromycin ophthalmic ointment Place a 1/2 inch ribbon of ointment into the left  lower eyelid tid x 4 days (Patient not taking: Reported on 03/24/2019) 1 g 0   . metoCLOPramide (REGLAN) 10 MG tablet Take 1 tablet (10 mg total) by mouth every 6 (six) hours. 120 tablet 0  . Prenatal Vit-Fe Fumarate-FA (PRENATAL VITAMINS PO) Take 1 tablet by mouth daily.    . traMADol (ULTRAM) 50 MG tablet Take 1 tablet (50 mg total) by mouth every 6 (six) hours as needed. (Patient not taking: Reported on 10/10/2019) 16 tablet 0  . triamcinolone cream (KENALOG) 0.1 % Apply 1 application topically 2 (two) times daily. (Patient not taking: Reported on 03/24/2019) 80 g 0   No current facility-administered medications on file prior to visit.    Review of Systems Pertinent items noted in HPI and remainder of comprehensive ROS otherwise negative.  Exam   Vitals:   10/10/19 1018  BP: 126/71  Pulse: 91  Weight: 135 lb 6.4 oz (61.4 kg)   Fetal Heart Rate (bpm): 160  Physical Exam  Constitutional: She is oriented to person, place, and time and well-developed, well-nourished, and in no distress. No distress.  HENT:  Head: Normocephalic.  Cardiovascular: Normal rate.  Pulmonary/Chest: Effort normal.  Abdominal: Soft. There is no abdominal tenderness.  Neurological: She is  alert and oriented to person, place, and time.  Skin: Skin is warm and dry.  Psychiatric: Affect normal.  Nursing note and vitals reviewed.   Assessment:   Pregnancy: G1P0000 Patient Active Problem List   Diagnosis Date Noted  . Supervision of low-risk pregnancy 09/27/2019  . Chlamydia infection   . Chlamydia trachomatis infection in mother during first trimester of pregnancy 08/18/2019  . IUP (intrauterine pregnancy), incidental 08/14/2019  . Bacterial vaginosis 08/14/2019  . Morning sickness 08/14/2019     Plan:  1. Encounter for supervision of low-risk pregnancy in second trimester - routine care    Initial labs drawn. Continue prenatal vitamins. Genetic Screening discussed, AFP and NIPS: requested. Ultrasound discussed; fetal anatomic survey: requested. Problem list reviewed and  updated. The nature of [facility] [place] with multiple MDs and other Advanced Practice Providers was explained to patient; also emphasized that residents, students are part of our team. Routine obstetric precautions reviewed. 50% of 45 min visit spent in counseling and coordination of care. Return in about 4 weeks (around 11/07/2019) for virtual visit .   Marcille Buffy DNP, CNM  10/10/19  10:25 AM

## 2019-10-10 NOTE — Addendum Note (Signed)
Addended by: Henrietta Dine on: 10/10/2019 11:19 AM   Modules accepted: Orders

## 2019-10-10 NOTE — Patient Instructions (Signed)
AREA PEDIATRIC/FAMILY PRACTICE PHYSICIANS  [place] ([address]) . [facility] o Chambliss, MD; Eniola, MD; Hale, MD; Hensel, MD; McDiarmid, MD; McIntyer, MD; Kaeya Schiffer, MD; Walden, MD o [address] [place], [place] [address] o [phone] o Mon-Fri 8:30-12:30, 1:30-5:00 o Providers come to see babies at [facility] o Accepting Medicaid . [facility] o Limited providers who accept newborns: Koirala, MD; Morrow, MD; Wolters, MD o [address], [place], [place] [address] o [phone] o Mon-Fri 8:00-5:30 o Babies seen by providers at [facility] o Does NOT accept Medicaid o Please call early in hospitalization for appointment (limited availability)  . [facility] o Mulberry, MD o [address] [place], [place] [address] o [phone] o Mon, Tue, Thur, Fri 8:30-5:00, Wed 10:00-7:00 (closed 1-2pm) o Babies seen by [facility] providers o Accepting Medicaid . [facility] o Rubin, MD o [address], [place], [place] [address] o [phone] o Mon-Fri 8:30-5:00, Sat 8:30-12:00 o Provider comes to see babies at [facility] o Accepting Medicaid o Must have been referred from current patients or contacted office prior to delivery . [facility] ([facility]) o Brown, MD; Chandler, MD; Ettefagh, MD; Grant, MD; Lester, MD; McCormick, MD; McQueen, MD; Prose, MD; Simha, MD; Stanley, MD; Stryffeler, NP; Tebben, NP o [address], [place], [place] [address] o [phone] o Mon, Tue, Thur, Fri 8:30-5:30, Wed 9:30-5:30, Sat 8:30-12:30 o Babies seen by [facility] providers o Accepting Medicaid o Only accepting infants of first-time parents or siblings of current patients o Hospital discharge coordinator will make follow-up appointment . Jack Amos o [address],  [place], [place]  [address] o [phone]   Fax - [phone] . [facility] o [address], [place], [place]  [address] o Phone - [phone]   Fax - [phone] . Shilpa Gosrani o [address], [place], [place]  [address] o [phone]  [place] ([address]) . [facility] o Bates, MD; [place], MD; Cooper, Cox, MD; MD; Davis, MD; Dovico, MD; Ettefaugh, MD; Little, MD; Lowe, MD; Keiffer, MD; Melvin, MD; Sumner, MD; Williams, MD o [address], [place], [place] [address] o [phone] o Mon-Fri 8:30-5:00 (extended evenings Mon-Thur as needed), Sat-Sun 10:00-1:00 o Providers come to see babies at [facility] o Accepting Medicaid for families of first-time babies and families with all children in the household age 3 and under. Must register with office prior to making appointment (M-F only). . [facility] o Henson, NP; Knapp, MD; Lalonde, MD; Tysinger, PA o [address] [place], [place] [address] o [phone] o Mon-Fri 8:00-5:00 o Babies seen by providers at [facility] o Does NOT accept Medicaid/Commercial Insurance Only . [facility] [place] ([facility])  o Artis, MD; Barnes, MD; Bratton, MD; Coccaro, MD; Lockett Gardner, MD; Kramer, MD; Marshall, MD; Netherton, MD; Poleto, MD; Skinner, MD o [address] [place], [place] [address] o [phone] o Mon-Fri 8:30-5:30, Sat (Oct.-Mar.) 9:00-1:00 o Babies seen by providers at [facility] o Accepting Medicaid  [place] ([address]) . [facility] o Reid, MD; Warner, MD o [address], [place],  [address] o [phone] o Mon-Fri 8:30-5:00, Sat 8:30-12:00 o Providers come to see babies at [facility] o Does NOT accept Medicaid . [facility] o Becker, PA; Hagler, MD; Scifres, PA; Sun, MD; Swayne, MD o [address],  [place], [place] [address] o [phone] o Mon-Fri 8:00-5:00 o Babies seen by providers at [facility] o Does NOT accept Medicaid o Only accepting babies of parents who are patients o Please call early in hospitalization for appointment (limited availability) . [facility] o Clark, MD; Frye, MD; Kelleher, MD; Mack, NP; Miller, MD; O'Keller, MD; Patterson, NP; Pudlo, MD; Puzio, MD; Thomas, MD; Tucker, MD; Twiselton, MD o [address], [place], [place] [address] o [phone] o Mon-Fri 8:00-5:00, Sat 9:00-12:00 o Providers come to see babies at [facility] o Does NOT accept Medicaid  [place] ([address]) . [facility] o Limited providers accepting new patients: Brake, NP; Wharton, PA o [address], [place], [place] [address] o [phone] o Mon-Fri 8:00-5:00 o Babies seen by providers at [facility] o Does NOT accept Medicaid o Only accepting babies of parents who are patients o Please call early in hospitalization for appointment (limited availability) . [facility] o Gay, MD; Quinlan, MD o [address] [place], [place] [address] o [phone] (press 1 to schedule appointment) o Mon-Fri 8:00-5:00 o Providers come to see babies at [facility] o Does NOT accept Medicaid . [facility] o Mazer, MD o [address] [place], [place] [address] o [phone] o Mon-Fri 8:30-5:00 (lunch 12:30-1:00), extended hours by appointment only Wed 5:00-6:30 o Babies seen by [facility] providers o Accepting Medicaid . [facility] o Banks, MD; Jordan, MD; Koberlein, MD o [address], [place], [place] [address] o [phone] o Mon-Fri 8:00-5:00 o Babies seen by [facility] providers o Does NOT accept Medicaid . [facility] o Parker, MD; Hunter, MD; Wallace, DO o [address] [place], [place]  [address] o [phone] o Mon-Fri 8:00-5:00 o Babies seen by [facility] providers o Does NOT accept Medicaid . [facility] o Brandon, PA; Brecken, PA; Christy, NP; Dees, MD; DeClaire, MD; DeWeese, MD; Hansen, NP; Mills, NP; Parrish, NP; Smoot, NP; Summer, MD; Vapne, MD o [address] [place], [place] [address] o [phone] o Mon-Fri 8:30-5:00, Sat 10:00-1:00 o Providers come to see babies at [facility] o Does NOT accept Medicaid o Free prenatal information session Tuesdays at 4:45pm . [facility] o Bouska, MD; Gordon, PA; Jeffery, PA; Weber, PA o [address] [place] [address] o [phone] o Mon-Fri 7:30-5:30 o Babies seen by [facility] providers . [facility] o [address], [place], [place], [place]  [address] o [phone]   Fax - [phone]  [place] ([phone]) . [facility] o [place]ese, [place] o [address] [place], [place] [address] o [phone] o Mon-Thur 8:00-6:00 o Providers come to see babies at [facility] o Accepting Medicaid . [facility] o Anderson, NP; Badger, MD; Beal, PA; Spencer, PA o [address] [place],  [address] o [phone] o Mon-Thur 7:30-7:30, Fri 7:30-4:30 o Babies seen by [facility] providers o Accepting Medicaid . [facility] o Agbuya, MD; Klett, NP; Romgoolam, MD o [address], ,  [address] o [phone] o Mon-Fri 8:30-5:00, Sat 8:30-12:00 o Providers come to see babies at [facility] o Accepting Medicaid o Must have "Meet & Greet" appointment at office prior to delivery . [facility]  ([facility] ) o McCord,   MD; Juleen China, MD; Clydene Laming, [address], [place], [place] [address] o [phone] o Mon-Wed 8:00-6:00, Thur-Fri 8:00-5:00, Sat 9:00-12:00 o Providers come to  see babies at [facility] o Does NOT accept Medicaid o Only accepting siblings of current patients . [facility] [place], [place], [place], [place]  [address] o [phone]   Fax [phone] . [facility] [address], [place], [place]  [address] o [phone]   Fax - Morton Gorman [phone] & [phone]) . Therapist, music at [place], DO; [place], [address] [place], [place] [address] o [phone] o Mon-Fri 7:00-5:00 o Babies seen by [facility] providers o Does NOT accept Medicaid . [place], MD; [place], [place]; [place], [place], [place], [place] [address] o [phone] o Mon-Fri 8:00-5:00 o Babies seen by [facility] providers o Accepting Medicaid . Lamont, MD; [place], [place]; [place], NP; [place], [place], [place], [place] [address] o [phone] o Mon-Fri 8:00-5:00 o Babies seen by providers at [facility] [place] [phone]) . [facility] [place], [place] o [address] [place], [place] [address] o [phone] o Mon-Fri 8:00-5:00 o Babies seen by [facility] providers o Does NOT accept Medicaid o Limited availability, please call early in hospitalization to schedule follow-up . [facility] Leilani Merl, PA; Maisie Fus, MD; [place], MD; [place], [place]; Jeannine Kitten, MD; [place], [place] [facility] [address], [place], [place] [address] o [phone] o Mon-Fri 8:30-5:00, Sat 9:00-12:00 o Babies seen by providers at [facility] o Accepting Medicaid o Please register online then schedule online or call office o [email or web address] . [place] [facility]  [place]) Kristian Covey, NP; Dwyane Dee, MD; Leonidas Romberg, PA o [address] [place], [place], [place] [address] o [phone] o Mon-Fri 8:00-5:00 o Babies seen by providers at [facility] o Accepting Medicaid . [place] ([facility]) Dairl Ponder, MD; Rayvon Char, NP; Melina Modena, MD o [address] [place], [place], [place] [address] o [phone] o Mon-Fri 8:00-5:30, Sat&Sun by appointment (phones open at 8:30) o Babies seen by [facility] providers o Accepting Medicaid o Must be a first-time baby or sibling of current patient . [place], [address], [place], [place]  [address] o [phone]   Fax - [phone]  [place] [phone] & [phone]) . [place], [place]; [place], [place]; Benjamine Mola, MD; [place], [place]; Harrell Lark, MD o [address] [place], [place] [address] o [phone] o Mon-Thur 8:00-7:00, Fri 8:00-5:00, Sat 8:00-12:00, Sun 9:00-12:00 o Babies seen by [facility] providers o Accepting Medicaid . [facility], MD; Ruthann Cancer, MD; [facility], MD o [address], [place], [place] [address] o [phone] o Mon-Thur 8:00-5:00 o Babies seen by providers at [facility] o Accepting Medicaid . [facility] [place], MD; Coe-Goins, MD; Amedeo Plenty, MD; Bobby Rumpf, MD; List, MD; Lavonia Drafts, MD; Ruthann Cancer, MD; Selinda Eon, MD; Audie Box, MD; Jim Like, MD; Christie Nottingham, MD; Hubbard Hartshorn, MD; Modena Nunnery, MD o [address] [place], [place]  [address] o [phone] o Mon-Fri 8:00-5:30, Sat (Oct.-Mar.) 9:00-1:00 o Babies seen by providers at [facility] o Accepting Medicaid o Must fill out new patient packet, available online at [email or web address] . [facility] ([facility] o Friddle, NP; Harris, NP; Kelly, NP; Logan, MD;  Melvin, PA; Poth, MD; Ramadoss, MD; Stanton, NP o [address], [place], [place] [address] o [phone] o Mon-Thur 8:00-5:30, Fri 8:00-5:00 o Babies seen by providers at [facility] o Accepting Medicaid  [place] ([address]) . [facility] o Dixon, PA; Dawson, MD; Pickard, MD; Tapia, PA o [address], [place], [place] [address] o [phone] o Mon-Fri 8:00-5:00 o Babies seen by providers at [facility] o Accepting Medicaid   [place] ([address]) . [facility] [place] o Masneri, DO; Meyers, MD; Nelson, PA o [address], [place], [place] [address] o [phone] o Mon-Fri 8:00-5:00 o Babies seen by providers at [facility] o Does NOT accept Medicaid o Limited appointment availability, please call early in hospitalization  . [facility] [place] o Kunedd, DO; McGowen, MD o [address], [place], [place] [address] o [phone] o Mon-Fri 8:00-5:00 o Babies seen by [facility] providers o Does NOT accept Medicaid . [facility] [place] o Cameron, MD; MacDonald, MD; Michaels, PA; Nayak, MD o [address] [place] [address], [place], [place] [address] o [phone] o Mon-Fri 8:00-5:00 o After hours clinic ([address] [place], [place] [address]) [phone] Mon-Fri 5:00-8:00, Sat 12:00-6:00, Sun 10:00-4:00 o Babies seen by [facility] providers o Accepting Medicaid . [facility] [place] o [address], [place], [place]  [address] o [phone]   Fax - [phone]  [place] ([address]) . [facility] [place] [facility] o Andy, MD o [address], [place], [place] [address] o [phone] o Mon-Fri 8:00-5:00 o Babies seen by [facility] providers o Does NOT accept Medicaid . [facility] [place] ([facility] [place]) o Eksir, MD o [address], [place], [place]  [address] o [phone] o Mon-Thur 8:00-7:00, Fri 8:00-5:00, Sat 8:00-12:00 o Babies seen by providers at [facility] o Accepting Medicaid - but does not have vaccinations in office (must be received elsewhere) o Limited availability, please call early in hospitalization  [place] ([address]) . [facility]  o Charlene Flemming, MD o [address], [place] [address] o [phone]  Fax [phone] Places to have your son circumcised:                                                                      [facility]     [phone]   $480 while you are in hospital         [facility]              [phone]   $269 by 4 wks                      [facility]                     [phone]   $  269 by 7 days [facility]                    [phone]   $269 by 4 wks [facility]             [phone]   $225 by 2 wks    These prices sometimes change but are roughly what you can expect to pay. Please call and confirm pricing.   Circumcision is considered an elective/non-medically necessary procedure. There are many reasons parents decide to have their sons circumsized. During the first year of life circumcised males have a reduced risk of urinary tract infections but after this year the rates between circumcised males and uncircumcised males are the same.  It is safe to have your son circumcised outside of the hospital and the places above perform them regularly.   Deciding about Circumcision in Baby Boys  (Up-to-date The Basics)  What is circumcision?   Circumcision is a surgery that removes the skin that covers the tip of the penis, called the "foreskin" Circumcision is usually done when a boy is between 1 and 10 days old. In the [place], circumcision is common. In some other countries, fewer boys are circumcised. Circumcision is a common tradition in some religions.  Should I have my baby boy circumcised?   There is no easy answer. Circumcision has some benefits. But it also has risks.  After talking with your doctor, you will have to decide for yourself what is right for your family.  What are the benefits of circumcision?   Circumcised boys seem to have slightly lower rates of: ?Urinary tract infections ?Swelling of the opening at the tip of the penis Circumcised men seem to have slightly lower rates of: ?Urinary tract infections ?Swelling of the opening at the tip of the penis ?Penis cancer ?HIV and other infections that you catch during sex ?Cervical cancer in the women they have sex with Even so, in the [place], the risks of these problems are small - even in boys and men who have not been circumcised. Plus, boys and men who are not circumcised can reduce these extra risks by: ?Cleaning their penis well ?Using condoms during sex  What are the risks of circumcision?  Risks include: ?Bleeding or infection from the surgery ?Damage to or amputation of the penis ?A chance that the doctor will cut off too much or not enough of the foreskin ?A chance that sex won't feel as good later in life Only about 1 out of every 200 circumcisions leads to problems. There is also a chance that your health insurance won't pay for circumcision.  How is circumcision done in baby boys?  First, the baby gets medicine for pain relief. This might be a cream on the skin or a shot into the base of the penis. Next, the doctor cleans the baby's penis well. Then he or she uses special tools to cut off the foreskin. Finally, the doctor wraps a bandage (called gauze) around the baby's penis. If you have your baby circumcised, his doctor or nurse will give you instructions on how to care for him after the surgery. It is important that you follow those instructions carefully.  

## 2019-10-11 LAB — OBSTETRIC PANEL, INCLUDING HIV
Antibody Screen: NEGATIVE
Basophils Absolute: 0 10*3/uL (ref 0.0–0.2)
Basos: 0 %
EOS (ABSOLUTE): 0.4 10*3/uL (ref 0.0–0.4)
Eos: 4 %
HIV Screen 4th Generation wRfx: NONREACTIVE
Hematocrit: 39.2 % (ref 34.0–46.6)
Hemoglobin: 13.3 g/dL (ref 11.1–15.9)
Hepatitis B Surface Ag: NEGATIVE
Immature Grans (Abs): 0.1 10*3/uL (ref 0.0–0.1)
Immature Granulocytes: 1 %
Lymphocytes Absolute: 2 10*3/uL (ref 0.7–3.1)
Lymphs: 20 %
MCH: 29.8 pg (ref 26.6–33.0)
MCHC: 33.9 g/dL (ref 31.5–35.7)
MCV: 88 fL (ref 79–97)
Monocytes Absolute: 0.8 10*3/uL (ref 0.1–0.9)
Monocytes: 8 %
Neutrophils Absolute: 6.8 10*3/uL (ref 1.4–7.0)
Neutrophils: 67 %
Platelets: 238 10*3/uL (ref 150–450)
RBC: 4.46 x10E6/uL (ref 3.77–5.28)
RDW: 12.6 % (ref 11.7–15.4)
RPR Ser Ql: NONREACTIVE
Rh Factor: POSITIVE
Rubella Antibodies, IGG: 2.76 index (ref >0.99)
WBC: 10.2 10*3/uL (ref 3.4–10.8)

## 2019-10-12 LAB — CERVICOVAGINAL ANCILLARY ONLY
Chlamydia: NEGATIVE
Comment: NEGATIVE
Comment: NORMAL
Neisseria Gonorrhea: NEGATIVE

## 2019-10-12 LAB — URINE CULTURE, OB REFLEX

## 2019-10-12 LAB — CULTURE, OB URINE

## 2019-10-24 ENCOUNTER — Encounter: Payer: Self-pay | Admitting: *Deleted

## 2019-11-03 ENCOUNTER — Telehealth: Payer: Self-pay | Admitting: Lactation Services

## 2019-11-03 NOTE — Telephone Encounter (Signed)
Called patient to inform her we have received a notice that her Genetic Screening results have been delayed. There was no answer and no answering machine came on. Will send My Chart message.

## 2019-11-07 ENCOUNTER — Other Ambulatory Visit: Payer: Self-pay

## 2019-11-07 ENCOUNTER — Telehealth (INDEPENDENT_AMBULATORY_CARE_PROVIDER_SITE_OTHER): Payer: Medicaid Other | Admitting: Student

## 2019-11-07 DIAGNOSIS — Z3A17 17 weeks gestation of pregnancy: Secondary | ICD-10-CM

## 2019-11-07 DIAGNOSIS — Z3402 Encounter for supervision of normal first pregnancy, second trimester: Secondary | ICD-10-CM

## 2019-11-07 DIAGNOSIS — Z3492 Encounter for supervision of normal pregnancy, unspecified, second trimester: Secondary | ICD-10-CM

## 2019-11-07 NOTE — Progress Notes (Signed)
I connected with@ on 11/07/19 at  2:15 PM EDT by: mychart video and verified that I am speaking with the correct person using two identifiers.  Patient is located at home and provider is located at [facility].     The purpose of this virtual visit is to provide medical care while limiting exposure to the novel coronavirus. I discussed the limitations, risks, security and privacy concerns of performing an evaluation and management service by mychart and the availability of in person appointments. I also discussed with the patient that there may be a patient responsible charge related to this service. By engaging in this virtual visit, you consent to the provision of healthcare.  Additionally, you authorize for your insurance to be billed for the services provided during this visit.  The patient expressed understanding and agreed to proceed.  The following staff members participated in the virtual visit:  Ralene Bathe RN    PRENATAL VISIT NOTE  Subjective:  Rachael Gilbert is a 19 y.o. G1P0000 at [redacted]w[redacted]d  for phone visit for ongoing prenatal care.  She is currently monitored for the following issues for this low-risk pregnancy and has IUP (intrauterine pregnancy), incidental; Bacterial vaginosis; Morning sickness; Chlamydia trachomatis infection in mother during first trimester of pregnancy; and Supervision of low-risk pregnancy on their problem list.  Patient reports no complaints.  Contractions: Not present. Vag. Bleeding: None.   . Denies leaking of fluid.   The following portions of the patient's history were reviewed and updated as appropriate: allergies, current medications, past family history, past medical history, past social history, past surgical history and problem list.   Objective:   Vitals:   11/07/19 1405  BP: 113/77  Pulse: 87   Self-Obtained  Fetal Status:           Assessment and Plan:  Pregnancy: G1P0000 at [redacted]w[redacted]d 1. Encounter for supervision of low-risk pregnancy in  second trimester -doing well -reviewed upcoming visit -will get AFP at anatomy ultrasound appointment  Preterm labor symptoms and general obstetric precautions including but not limited to vaginal bleeding, contractions, leaking of fluid and fetal movement were reviewed in detail with the patient.  Return in about 4 weeks (around 12/05/2019) for Routine OB, virtual.  Future Appointments  Date Time Provider Department Center  11/17/2019  1:00 PM WH-MFC [place] 3 WH-MFCUS MFC-US  11/17/2019  2:10 PM WOC-WOCA LAB WOC-WOCA WOC  12/05/2019 11:15 AM Burleson, Brand Males, NP WOC-WOCA WOC     Time spent on virtual visit: 6 minutes  Judeth Horn, NP

## 2019-11-07 NOTE — Patient Instructions (Signed)
Childbirth Education Options: [facility] Classes:  Childbirth education classes can help you get ready for a positive parenting experience. You can also meet other expectant parents and get free stuff for your baby. Each class runs for five weeks on the same night and costs $45 for the mother-to-be and her support person. Medicaid covers the cost if you are eligible. Call [phone] to register. [facility] Childbirth Education:  [phone] or [phone] or [email or web address]  Baby & Me Class: Discuss newborn & infant parenting and family adjustment issues with other new mothers in a relaxed environment. Each week brings a new speaker or baby-centered activity. We encourage new mothers to join us every Thursday at 11:00am. Babies birth until crawling. No registration or fee. Daddy Boot Camp: This course offers Dads-to-be the tools and knowledge needed to feel confident on their journey to becoming new fathers. Experienced dads, who have been trained as coaches, teach dads-to-be how to hold, comfort, diaper, swaddle and play with their infant while being able to support the new mom as well. A class for men taught by men. $25/dad Big Brother/Big Sister: Let your children share in the joy of a new brother or sister in this special class designed just for them. Class includes discussion about how families care for babies: swaddling, holding, diapering, safety as well as how they can be helpful in their new role. This class is designed for children ages 2 to 6, but any age is welcome. Please register each child individually. $5/child  Mom Talk: This mom-led group offers support and connection to mothers as they journey through the adjustments and struggles of that sometimes overwhelming first year after the birth of a child. Tuesdays at 10:00am and Thursdays at 6:00pm. Babies welcome. No registration or fee. Breastfeeding Support Group: This group is a mother-to-mother  support circle where moms have the opportunity to share their breastfeeding experiences. A Lactation Consultant is present for questions and concerns. Meets each Tuesday at 11:00am. No fee or registration. Breastfeeding Your Baby: Learn what to expect in the first days of breastfeeding your newborn.  This class will help you feel more confident with the skills needed to begin your breastfeeding experience. Many new mothers are concerned about breastfeeding after leaving the hospital. This class will also address the most common fears and challenges about breastfeeding during the first few weeks, months and beyond. (call for fee) Comfort Techniques and Tour: This 2 hour interactive class will provide you the opportunity to learn & practice hands-on techniques that can help relieve some of the discomfort of labor and encourage your baby to rotate toward the best position for birth. You and your partner will be able to try a variety of labor positions with birth balls and rebozos as well as practice breathing, relaxation, and visualization techniques. A tour of the [facility] is included with this class. $20 per registrant and support person Childbirth Class- Weekend Option: This class is a Weekend version of our Birth & Baby series. It is designed for parents who have a difficult time fitting several weeks of classes into their schedule. It covers the care of your newborn and the basics of labor and childbirth. It also includes a Maternity Care Center Tour of [facility] and lunch. The class is held two consecutive days: beginning on Friday evening from 6:30 - 8:30 p.m. and the next day, Saturday from 9 a.m. - 4 p.m. (call for fee) Waterbirth Class: Interested in a waterbirth?  This   informational class will help you discover whether waterbirth is the right fit for you. Education about waterbirth itself, supplies you would need and how to assemble your support team is what you can  expect from this class. Some obstetrical practices require this class in order to pursue a waterbirth. (Not all obstetrical practices offer waterbirth-check with your healthcare provider.) Register only the expectant mom, but you are encouraged to bring your partner to class! Required if planning waterbirth, no fee. Infant/Child CPR: Parents, grandparents, babysitters, and friends learn Cardio-Pulmonary Resuscitation skills for infants and children. You will also learn how to treat both conscious and unconscious choking in infants and children. This Family & Friends program does not offer certification. Register each participant individually to ensure that enough mannequins are available. (Call for fee) Grandparent Love: Expecting a grandbaby? This class is for you! Learn about the latest infant care and safety recommendations and ways to support your own child as he or she transitions into the parenting role. Taught by Registered Nurses who are childbirth instructors, but most importantly...they are grandmothers too! $10/person. Childbirth Class- Natural Childbirth: This series of 5 weekly classes is for expectant parents who want to learn and practice natural methods of coping with the process of labor and childbirth. Relaxation, breathing, massage, visualization, role of the partner, and helpful positioning are highlighted. Participants learn how to be confident in their body's ability to give birth. This class will empower and help parents make informed decisions about their own care. Includes discussion that will help new parents transition into the immediate postpartum period. Maternity Care Center Tour of [facility] is included. We suggest taking this class between 25-32 weeks, but it's only a recommendation. $75 per registrant and one support person or $30 Medicaid. Childbirth Class- 3 week Series: This option of 3 weekly classes helps you and your labor partner prepare for childbirth. Newborn  care, labor & birth, cesarean birth, pain management, and comfort techniques are discussed and a Maternity Care Center Tour of [facility] is included. The class meets at the same time, on the same day of the week for 3 consecutive weeks beginning with the starting date you choose. $60 for registrant and one support person.  Marvelous Multiples: Expecting twins, triplets, or more? This class covers the differences in labor, birth, parenting, and breastfeeding issues that face multiples' parents. NICU tour is included. Led by a Certified Childbirth Educator who is the mother of twins. No fee. Caring for Baby: This class is for expectant and adoptive parents who want to learn and practice the most up-to-date newborn care for their babies. Focus is on birth through the first six weeks of life. Topics include feeding, bathing, diapering, crying, umbilical cord care, circumcision care and safe sleep. Parents learn to recognize symptoms of illness and when to call the pediatrician. Register only the mom-to-be and your partner or support person can plan to come with you! $10 per registrant and support person Childbirth Class- online option: This online class offers you the freedom to complete a Birth and Baby series in the comfort of your own home. The flexibility of this option allows you to review sections at your own pace, at times convenient to you and your support people. It includes additional video information, animations, quizzes, and extended activities. Get organized with helpful eClass tools, checklists, and trackers. Once you register online for the class, you will receive an email within a few days to accept the invitation and begin the class when the time   is right for you. The content will be available to you for 60 days. $60 for 60 days of online access for you and your support people.         

## 2019-11-07 NOTE — Progress Notes (Signed)
I connected with  [name] on 11/07/19 at  2:15 PM EDT by telephone and verified that I am speaking with the correct person using two identifiers.   I discussed the limitations, risks, security and privacy concerns of performing an evaluation and management service by telephone and the availability of in person appointments. I also discussed with the patient that there may be a patient responsible charge related to this service. The patient expressed understanding and agreed to proceed.   Ralene Bathe, RN 11/07/2019  2:02 PM

## 2019-11-08 ENCOUNTER — Encounter: Payer: Self-pay | Admitting: General Practice

## 2019-11-17 ENCOUNTER — Other Ambulatory Visit: Payer: Self-pay

## 2019-11-17 ENCOUNTER — Ambulatory Visit (HOSPITAL_COMMUNITY)
Admission: RE | Admit: 2019-11-17 | Payer: Medicaid Other | Source: Ambulatory Visit | Attending: Advanced Practice Midwife | Admitting: Advanced Practice Midwife

## 2019-11-17 ENCOUNTER — Other Ambulatory Visit: Payer: Medicaid Other

## 2019-11-18 ENCOUNTER — Other Ambulatory Visit: Payer: Medicaid Other

## 2019-11-18 ENCOUNTER — Other Ambulatory Visit (HOSPITAL_COMMUNITY): Payer: Medicaid Other

## 2019-11-22 ENCOUNTER — Other Ambulatory Visit (HOSPITAL_COMMUNITY): Payer: Self-pay | Admitting: *Deleted

## 2019-11-22 ENCOUNTER — Ambulatory Visit (HOSPITAL_COMMUNITY)
Admission: RE | Admit: 2019-11-22 | Discharge: 2019-11-22 | Disposition: A | Discharge status: Home / Self Care | Payer: Medicaid Other | Source: Ambulatory Visit | Attending: Obstetrics and Gynecology | Admitting: Obstetrics and Gynecology

## 2019-11-22 ENCOUNTER — Other Ambulatory Visit: Payer: Self-pay

## 2019-11-22 ENCOUNTER — Other Ambulatory Visit: Payer: Medicaid Other

## 2019-11-22 DIAGNOSIS — Z3492 Encounter for supervision of normal pregnancy, unspecified, second trimester: Secondary | ICD-10-CM

## 2019-11-22 DIAGNOSIS — Z363 Encounter for antenatal screening for malformations: Secondary | ICD-10-CM | POA: Diagnosis not present

## 2019-11-22 DIAGNOSIS — Z3A19 19 weeks gestation of pregnancy: Secondary | ICD-10-CM | POA: Diagnosis not present

## 2019-11-22 DIAGNOSIS — Z362 Encounter for other antenatal screening follow-up: Secondary | ICD-10-CM

## 2019-11-22 DIAGNOSIS — Z3490 Encounter for supervision of normal pregnancy, unspecified, unspecified trimester: Secondary | ICD-10-CM | POA: Insufficient documentation

## 2019-11-24 LAB — AFP, SERUM, OPEN SPINA BIFIDA
AFP MoM: 0.73
AFP Value: 43.8 ng/mL
Gest. Age on Collection Date: 19.4 weeks
Maternal Age At EDD: 20 yr
OSBR Risk 1 IN: 10000
Test Results:: NEGATIVE
Weight: 146 [lb_av]

## 2019-12-05 ENCOUNTER — Telehealth (INDEPENDENT_AMBULATORY_CARE_PROVIDER_SITE_OTHER): Payer: Medicaid Other | Admitting: Nurse Practitioner

## 2019-12-05 ENCOUNTER — Encounter: Payer: Self-pay | Admitting: Nurse Practitioner

## 2019-12-05 DIAGNOSIS — Z3A21 21 weeks gestation of pregnancy: Secondary | ICD-10-CM

## 2019-12-05 DIAGNOSIS — O210 Mild hyperemesis gravidarum: Secondary | ICD-10-CM

## 2019-12-05 DIAGNOSIS — Z3492 Encounter for supervision of normal pregnancy, unspecified, second trimester: Secondary | ICD-10-CM

## 2019-12-05 DIAGNOSIS — A568 Sexually transmitted chlamydial infection of other sites: Secondary | ICD-10-CM

## 2019-12-05 NOTE — Progress Notes (Signed)
I connected with@ on 12/05/19 at 11:15 AM EDT by: MyChart and verified that I am speaking with the correct person using two identifiers.  Patient is located at home and provider is located at [facility].     The purpose of this virtual visit is to provide medical care while limiting exposure to the novel coronavirus. I discussed the limitations, risks, security and privacy concerns of performing an evaluation and management service by MyChart and the availability of in person appointments. I also discussed with the patient that there may be a patient responsible charge related to this service. By engaging in this virtual visit, you consent to the provision of healthcare.  Additionally, you authorize for your insurance to be billed for the services provided during this visit.  The patient expressed understanding and agreed to proceed.  The following staff members participated in the virtual visit:  Marykay Lex, CMA and Nolene Bernheim, NP    PRENATAL VISIT NOTE  Subjective:  Rachael Gilbert is a 19 y.o. G1P0000 at [redacted]w[redacted]d  for phone visit for ongoing prenatal care.  She is currently monitored for the following issues for this low-risk pregnancy and has Bacterial vaginosis; Morning sickness; Chlamydia trachomatis infection in mother during first trimester of pregnancy; and Supervision of low-risk pregnancy on their problem list.  Patient reports no complaints.  Contractions: Not present. Vag. Bleeding: None.  Movement: Present. Denies leaking of fluid.   The following portions of the patient's history were reviewed and updated as appropriate: allergies, current medications, past family history, past medical history, past social history, past surgical history and problem list.   Objective:   Vitals:   12/05/19 1049  BP: 119/69  Pulse: 83   Self-Obtained  Fetal Status:     Movement: Present     Assessment and Plan:  Pregnancy: G1P0000 at [redacted]w[redacted]d 1. Encounter for supervision of low-risk pregnancy in  second trimester Advised to sign up for breastfeeding and childbirth classes - reviewed finding info on Babyscripts Advised to take BP weekly and put into babyscripts.  Advised to pick one consistent day so she can remember to do it weekly Next appointment is with a midwife  2. Morning sickness Usually no problem but did not eat today soon after awakening and was vomiting 2 hours later.    Preterm labor symptoms and general obstetric precautions including but not limited to vaginal bleeding, contractions, leaking of fluid and fetal movement were reviewed in detail with the patient.  Return in about 4 weeks (around 01/02/2020) for virtual ROB.  Future Appointments  Date Time Provider Department Center  12/05/2019 11:15 AM Currie Paris, NP WOC-WOCA WOC  12/20/2019  9:30 AM WH-MFC [place] 5 WH-MFCUS MFC-US  01/03/2020  9:15 AM Crisoforo Oxford, Charlesetta Garibaldi, CNM WOC-WOCA WOC     Time spent on virtual visit: 8 minutes  Currie Paris, NP

## 2019-12-13 ENCOUNTER — Encounter: Payer: Self-pay | Admitting: *Deleted

## 2019-12-20 ENCOUNTER — Ambulatory Visit (HOSPITAL_COMMUNITY)
Admission: RE | Admit: 2019-12-20 | Discharge: 2019-12-20 | Disposition: A | Discharge status: Home / Self Care | Payer: Medicaid Other | Source: Ambulatory Visit | Attending: Obstetrics and Gynecology | Admitting: Obstetrics and Gynecology

## 2019-12-20 ENCOUNTER — Other Ambulatory Visit: Payer: Self-pay

## 2019-12-20 DIAGNOSIS — O321XX0 Maternal care for breech presentation, not applicable or unspecified: Secondary | ICD-10-CM | POA: Diagnosis not present

## 2019-12-20 DIAGNOSIS — Z362 Encounter for other antenatal screening follow-up: Secondary | ICD-10-CM | POA: Diagnosis present

## 2019-12-20 DIAGNOSIS — Z3A23 23 weeks gestation of pregnancy: Secondary | ICD-10-CM

## 2019-12-27 ENCOUNTER — Encounter (HOSPITAL_COMMUNITY): Payer: Self-pay | Admitting: Obstetrics & Gynecology

## 2019-12-27 ENCOUNTER — Other Ambulatory Visit: Payer: Self-pay

## 2019-12-27 ENCOUNTER — Inpatient Hospital Stay (HOSPITAL_COMMUNITY)
Admission: AD | Admit: 2019-12-27 | Discharge: 2019-12-27 | Disposition: A | Discharge status: Home / Self Care | Payer: Medicaid Other | Attending: Obstetrics & Gynecology | Admitting: Obstetrics & Gynecology

## 2019-12-27 DIAGNOSIS — N898 Other specified noninflammatory disorders of vagina: Secondary | ICD-10-CM | POA: Insufficient documentation

## 2019-12-27 DIAGNOSIS — Z3A23 23 weeks gestation of pregnancy: Secondary | ICD-10-CM | POA: Insufficient documentation

## 2019-12-27 DIAGNOSIS — L293 Anogenital pruritus, unspecified: Secondary | ICD-10-CM | POA: Insufficient documentation

## 2019-12-27 DIAGNOSIS — O26892 Other specified pregnancy related conditions, second trimester: Secondary | ICD-10-CM | POA: Insufficient documentation

## 2019-12-27 LAB — WET PREP, GENITAL
Clue Cells Wet Prep HPF POC: NONE SEEN
Sperm: NONE SEEN
Trich, Wet Prep: NONE SEEN
Yeast Wet Prep HPF POC: NONE SEEN

## 2019-12-27 LAB — URINALYSIS, ROUTINE W REFLEX MICROSCOPIC
Bilirubin Urine: NEGATIVE
Glucose, UA: NEGATIVE mg/dL
Hgb urine dipstick: NEGATIVE
Ketones, ur: NEGATIVE mg/dL
Nitrite: NEGATIVE
Protein, ur: NEGATIVE mg/dL
Specific Gravity, Urine: 1.021 (ref 1.005–1.030)
pH: 8 (ref 5.0–8.0)

## 2019-12-27 MED ORDER — TERCONAZOLE 0.4 % VA CREA: 1.0000 | TOPICAL_CREAM | Freq: Every day | VAGINAL | 0 refills | Status: DC

## 2019-12-27 NOTE — MAU Provider Note (Signed)
First Provider Initiated Contact with Patient 12/27/19 1502     [name] is a 19 y.o. G2P0000 at [redacted]w[redacted]d who presents to MAU today with complaint of vaginal itching and discharge. She reports that the itching and discharge started occurring 2-3 days ago. Describes the discharge as white thin sometimes with odor. She denies vaginal bleeding, abdominal pain or urinary symptoms. +FM. Receives prenatal care at [facility] and next prenatal appointment is scheduled for next week.   O BP 119/65 (BP Location: Right Arm)   Pulse 96   Temp 98.5 F (36.9 C) (Oral)   Resp 16   Ht 5\' 3"  (1.6 m)   Wt 72.5 kg   LMP 07/14/2019   SpO2 99%   BMI 28.33 kg/m  Physical Exam  Nursing note and vitals reviewed. Constitutional: She is oriented to person, place, and time. She appears well-developed and well-nourished.  HENT:  Head: Normocephalic.  Cardiovascular: Normal rate and regular rhythm.  Respiratory: Effort normal and breath sounds normal. No respiratory distress. She has no wheezes.  GI: Soft. There is no abdominal tenderness. There is no rebound and no guarding.  Gravid appropriate for gestational age   Neurological: She is alert and oriented to person, place, and time.  Psychiatric: She has a normal mood and affect. Her behavior is normal. Thought content normal.   Labs reviewed:  Results for orders placed or performed during the hospital encounter of 12/27/19 (from the past 24 hour(s))  Wet prep, genital     Status: Abnormal   Collection Time: 12/27/19  1:54 PM   Specimen: Vaginal  Result Value Ref Range   Yeast Wet Prep HPF POC NONE SEEN NONE SEEN   Trich, Wet Prep NONE SEEN NONE SEEN   Clue Cells Wet Prep HPF POC NONE SEEN NONE SEEN   WBC, Wet Prep HPF POC MANY (A) NONE SEEN   Sperm NONE SEEN   Urinalysis, Routine w reflex microscopic     Status: Abnormal   Collection Time: 12/27/19  1:57 PM  Result Value Ref Range   Color, Urine YELLOW YELLOW   APPearance CLOUDY (A) CLEAR    Specific Gravity, Urine 1.021 1.005 - 1.030   pH 8.0 5.0 - 8.0   Glucose, UA NEGATIVE NEGATIVE mg/dL   Hgb urine dipstick NEGATIVE NEGATIVE   Bilirubin Urine NEGATIVE NEGATIVE   Ketones, ur NEGATIVE NEGATIVE mg/dL   Protein, ur NEGATIVE NEGATIVE mg/dL   Nitrite NEGATIVE NEGATIVE   Leukocytes,Ua TRACE (A) NEGATIVE   RBC / HPF 0-5 0 - 5 RBC/hpf   WBC, UA 11-20 0 - 5 WBC/hpf   Bacteria, UA FEW (A) NONE SEEN   Squamous Epithelial / LPF 6-10 0 - 5   Wet prep negative  GC/C pending  Will still treat for yeast infection based on clinical symptoms, will call patient with any abnormal results of GC/C  Discussed reasons to return to MAU. Follow up as scheduled in the office. Return to MAU as needed. Pt stable at time of discharge.   A 1. Vaginal irritation   2. Vaginal itching   3. [redacted] weeks gestation of pregnancy    Medical screening exam complete   P Discharge home Follow up as scheduled in the office for prenatal care Return to MAU as needed for reasons discussed and/or emergencies    02/26/20, CNM 12/27/2019 3:11 PM

## 2019-12-27 NOTE — MAU Note (Signed)
Having vaginal itching, started a few days ago. No pain, bleeding, lof. Some d/c

## 2019-12-28 LAB — GC/CHLAMYDIA PROBE AMP (~~LOC~~) NOT AT ARMC
Chlamydia: NEGATIVE
Comment: NEGATIVE
Comment: NORMAL
Neisseria Gonorrhea: NEGATIVE

## 2020-01-03 ENCOUNTER — Telehealth (INDEPENDENT_AMBULATORY_CARE_PROVIDER_SITE_OTHER): Payer: Medicaid Other | Admitting: Obstetrics and Gynecology

## 2020-01-03 ENCOUNTER — Other Ambulatory Visit: Payer: Self-pay

## 2020-01-03 ENCOUNTER — Encounter: Payer: Self-pay | Admitting: Obstetrics and Gynecology

## 2020-01-03 VITALS — BP 130/73 | HR 91

## 2020-01-03 DIAGNOSIS — Z3A24 24 weeks gestation of pregnancy: Secondary | ICD-10-CM

## 2020-01-03 DIAGNOSIS — Z3402 Encounter for supervision of normal first pregnancy, second trimester: Secondary | ICD-10-CM

## 2020-01-03 DIAGNOSIS — Z3492 Encounter for supervision of normal pregnancy, unspecified, second trimester: Secondary | ICD-10-CM

## 2020-01-03 NOTE — Progress Notes (Signed)
   OBSTETRICS PRENATAL VIRTUAL VISIT ENCOUNTER NOTE  Provider location: Center for [facility]   I connected with Rachael Gilbert on 01/03/20 at  9:15 AM EDT by MyChart Video Encounter at home and verified that I am speaking with the correct person using two identifiers.   I discussed the limitations, risks, security and privacy concerns of performing an evaluation and management service virtually and the availability of in person appointments. I also discussed with the patient that there may be a patient responsible charge related to this service. The patient expressed understanding and agreed to proceed. Subjective:  Rachael Gilbert is a 19 y.o. G2P0000 at [redacted]w[redacted]d being seen today for ongoing prenatal care.  She is currently monitored for the following issues for this low-risk pregnancy and has Bacterial vaginosis; Morning sickness; Chlamydia trachomatis infection in mother during first trimester of pregnancy; and Supervision of low-risk pregnancy on their problem list.  Patient reports no complaints.  Contractions: Not present. Vag. Bleeding: None.  Movement: Present. Denies any leaking of fluid.   The following portions of the patient's history were reviewed and updated as appropriate: allergies, current medications, past family history, past medical history, past social history, past surgical history and problem list.   Objective:   Vitals:   01/03/20 0937  BP: 130/73  Pulse: 91    Fetal Status:     Movement: Present     General:  Alert, oriented and cooperative. Patient is in no acute distress.  Respiratory: Normal respiratory effort, no problems with respiration noted  Mental Status: Normal mood and affect. Normal behavior. Normal judgment and thought content.  Rest of physical exam deferred due to type of encounter  Imaging:  Assessment and Plan:  Pregnancy: G2P0000 at [redacted]w[redacted]d  1. Encounter for supervision of low-risk pregnancy in second trimester  Changed date based on early [place] Patient has not had COVID vaccine. Reviewed risks/benefits in pregnancy/breastfeeding and gave info. She will consider and call with questions.   Preterm labor symptoms and general obstetric precautions including but not limited to vaginal bleeding, contractions, leaking of fluid and fetal movement were reviewed in detail with the patient. I discussed the assessment and treatment plan with the patient. The patient was provided an opportunity to ask questions and all were answered. The patient agreed with the plan and demonstrated an understanding of the instructions. The patient was advised to call back or seek an in-person office evaluation/go to MAU at [facility] for any urgent or concerning symptoms. Please refer to After Visit Summary for other counseling recommendations.   I provided 12 minutes of face-to-face time during this encounter.  Return in about 2 weeks (around 01/17/2020) for low OB, in person, 2 hr GTT, 3rd trim labs, Tdap.  No future appointments.  Conan Bowens, MD Center for [facility], [facility]

## 2020-01-03 NOTE — Progress Notes (Signed)
I connected with  Rachael Gilbert on 01/03/20 at  9:15 AM EDT by telephone and verified that I am speaking with the correct person using two identifiers.   I discussed the limitations, risks, security and privacy concerns of performing an evaluation and management service by telephone and the availability of in person appointments. I also discussed with the patient that there may be a patient responsible charge related to this service. The patient expressed understanding and agreed to proceed.  Marylynn Pearson, RN 01/03/2020  9:35 AM

## 2020-01-13 ENCOUNTER — Encounter (HOSPITAL_COMMUNITY): Payer: Self-pay | Admitting: Obstetrics and Gynecology

## 2020-01-13 ENCOUNTER — Other Ambulatory Visit: Payer: Self-pay

## 2020-01-13 ENCOUNTER — Inpatient Hospital Stay (HOSPITAL_COMMUNITY)
Admission: AD | Admit: 2020-01-13 | Discharge: 2020-01-13 | Disposition: A | Discharge status: Home / Self Care | Payer: Medicaid Other | Attending: Obstetrics and Gynecology | Admitting: Obstetrics and Gynecology

## 2020-01-13 DIAGNOSIS — O26892 Other specified pregnancy related conditions, second trimester: Secondary | ICD-10-CM | POA: Diagnosis not present

## 2020-01-13 DIAGNOSIS — Z8249 Family history of ischemic heart disease and other diseases of the circulatory system: Secondary | ICD-10-CM | POA: Insufficient documentation

## 2020-01-13 DIAGNOSIS — Z3A27 27 weeks gestation of pregnancy: Secondary | ICD-10-CM | POA: Diagnosis not present

## 2020-01-13 DIAGNOSIS — R42 Dizziness and giddiness: Secondary | ICD-10-CM | POA: Diagnosis present

## 2020-01-13 LAB — CBC WITH DIFFERENTIAL/PLATELET
Abs Immature Granulocytes: 0.3 10*3/uL — ABNORMAL HIGH (ref 0.00–0.07)
Basophils Absolute: 0 10*3/uL (ref 0.0–0.1)
Basophils Relative: 0 %
Eosinophils Absolute: 0.4 10*3/uL (ref 0.0–0.5)
Eosinophils Relative: 3 %
HCT: 33.4 % — ABNORMAL LOW (ref 36.0–46.0)
Hemoglobin: 11.1 g/dL — ABNORMAL LOW (ref 12.0–15.0)
Lymphocytes Relative: 17 %
Lymphs Abs: 2.4 10*3/uL (ref 0.7–4.0)
MCH: 31 pg (ref 26.0–34.0)
MCHC: 33.2 g/dL (ref 30.0–36.0)
MCV: 93.3 fL (ref 80.0–100.0)
Metamyelocytes Relative: 1 %
Monocytes Absolute: 0.8 10*3/uL (ref 0.1–1.0)
Monocytes Relative: 6 %
Myelocytes: 1 %
Neutro Abs: 10.1 10*3/uL — ABNORMAL HIGH (ref 1.7–7.7)
Neutrophils Relative %: 72 %
Platelets: 247 10*3/uL (ref 150–400)
RBC: 3.58 MIL/uL — ABNORMAL LOW (ref 3.87–5.11)
RDW: 13 % (ref 11.5–15.5)
WBC: 14 10*3/uL — ABNORMAL HIGH (ref 4.0–10.5)
nRBC: 0 % (ref 0.0–0.2)
nRBC: 0 /100 WBC

## 2020-01-13 LAB — COMPREHENSIVE METABOLIC PANEL
ALT: 19 U/L (ref 0–44)
AST: 28 U/L (ref 15–41)
Albumin: 2.6 g/dL — ABNORMAL LOW (ref 3.5–5.0)
Alkaline Phosphatase: 98 U/L (ref 38–126)
Anion gap: 12 (ref 5–15)
BUN: 7 mg/dL (ref 6–20)
CO2: 19 mmol/L — ABNORMAL LOW (ref 22–32)
Calcium: 9 mg/dL (ref 8.9–10.3)
Chloride: 105 mmol/L (ref 98–111)
Creatinine, Ser: 0.52 mg/dL (ref 0.44–1.00)
GFR calc Af Amer: >60 mL/min (ref >60)
GFR calc non Af Amer: >60 mL/min (ref >60)
Glucose, Bld: 99 mg/dL (ref 70–99)
Potassium: 3.9 mmol/L (ref 3.5–5.1)
Sodium: 136 mmol/L (ref 135–145)
Total Bilirubin: 0.6 mg/dL (ref 0.3–1.2)
Total Protein: 6.5 g/dL (ref 6.5–8.1)

## 2020-01-13 LAB — URINALYSIS, ROUTINE W REFLEX MICROSCOPIC
Bilirubin Urine: NEGATIVE
Glucose, UA: 50 mg/dL — AB
Hgb urine dipstick: NEGATIVE
Ketones, ur: NEGATIVE mg/dL
Leukocytes,Ua: NEGATIVE
Nitrite: NEGATIVE
Protein, ur: NEGATIVE mg/dL
Specific Gravity, Urine: 1.019 (ref 1.005–1.030)
pH: 5 (ref 5.0–8.0)

## 2020-01-13 MED ORDER — LACTATED RINGERS IV BOLUS
1000.0000 mL | Freq: Once | INTRAVENOUS | Status: AC
  Administered 2020-01-13: 1000 mL via INTRAVENOUS

## 2020-01-13 NOTE — MAU Provider Note (Signed)
History     CSN: [id number]  Arrival date and time: 01/13/20 1539   First Provider Initiated Contact with Patient 01/13/20 1629      Chief Complaint  Patient presents with  . Dizziness   Rachael Gilbert is a 19 y.o. G1P0000 at 54w0dwho presents today with dizziness. She states that this started yesterday and has been off and on since then. She denies any VB, LOF or contractions. She reports normal fetal movement.   Dizziness This is a new problem. The current episode started yesterday. The problem occurs intermittently. The problem has been unchanged. Pertinent negatives include no chills, fever, headaches, nausea or vomiting. Nothing aggravates the symptoms. She has tried nothing for the symptoms.    OB History    Gravida  1   Para  0   Term  0   Preterm  0   AB  0   Living  0     SAB  0   TAB  0   Ectopic  0   Multiple  0   Live Births              Past Medical History:  Diagnosis Date  . Asthma   . Chlamydia infection   . UTI (urinary tract infection)   . Vitiligo     Past Surgical History:  Procedure Laterality Date  . NO PAST SURGERIES    . WISDOM TOOTH EXTRACTION      Family History  Problem Relation Age of Onset  . Bipolar disorder Mother   . Heart disease Mother   . Hypertension Mother   . Bipolar disorder Father     Social History   Tobacco Use  . Smoking status: Never Smoker  . Smokeless tobacco: Never Used  Substance Use Topics  . Alcohol use: Not Currently  . Drug use: Not Currently    Types: Marijuana    Comment: last used in 2020    Allergies: No Known Allergies  Medications Prior to Admission  Medication Sig Dispense Refill Last Dose  . fexofenadine (ALLEGRA) 180 MG tablet Take 180 mg by mouth daily as needed for allergies.    01/13/2020 at Unknown time  . prenatal vitamin w/FE, FA (NATACHEW) 29-1 MG CHEW chewable tablet Chew 1 tablet by mouth daily at 12 noon. 30 tablet 11 01/13/2020 at Unknown time  . Blood  Pressure Monitoring (BLOOD PRESSURE KIT) DEVI 1 Device by Does not apply route as needed. 1 each 0     Review of Systems  Constitutional: Negative for chills and fever.  Respiratory: Negative for shortness of breath.   Gastrointestinal: Negative for nausea and vomiting.  Genitourinary: Negative for dysuria, pelvic pain, vaginal bleeding and vaginal discharge.  Neurological: Positive for dizziness. Negative for headaches.   Physical Exam   Blood pressure 122/73, pulse 98, temperature 99 F (37.2 C), temperature source Oral, resp. rate 18, weight 75.9 kg, last menstrual period 07/14/2019, SpO2 99 %, unknown if currently breastfeeding.  Physical Exam  Nursing note and vitals reviewed. Constitutional: She is oriented to person, place, and time. She appears well-developed and well-nourished. No distress.  HENT:  Head: Normocephalic.  Cardiovascular: Normal rate.  Respiratory: Effort normal.  GI: Soft. There is no abdominal tenderness. There is no rebound.  Neurological: She is alert and oriented to person, place, and time.  Skin: Skin is warm and dry.  Psychiatric: She has a normal mood and affect.   NST:  Baseline: 145 Variability: moderate Accels: 10x10 Decels:  none Toco: none Reactive/Appropriate for GA   Results for orders placed or performed during the hospital encounter of 01/13/20 (from the past 24 hour(s))  Urinalysis, Routine w reflex microscopic     Status: Abnormal   Collection Time: 01/13/20  4:02 PM  Result Value Ref Range   Color, Urine YELLOW YELLOW   APPearance CLEAR CLEAR   Specific Gravity, Urine 1.019 1.005 - 1.030   pH 5.0 5.0 - 8.0   Glucose, UA 50 (A) NEGATIVE mg/dL   Hgb urine dipstick NEGATIVE NEGATIVE   Bilirubin Urine NEGATIVE NEGATIVE   Ketones, ur NEGATIVE NEGATIVE mg/dL   Protein, ur NEGATIVE NEGATIVE mg/dL   Nitrite NEGATIVE NEGATIVE   Leukocytes,Ua NEGATIVE NEGATIVE  CBC with Differential/Platelet     Status: Abnormal   Collection Time:  01/13/20  5:11 PM  Result Value Ref Range   WBC 14.0 (H) 4.0 - 10.5 K/uL   RBC 3.58 (L) 3.87 - 5.11 MIL/uL   Hemoglobin 11.1 (L) 12.0 - 15.0 g/dL   HCT 33.4 (L) 36.0 - 46.0 %   MCV 93.3 80.0 - 100.0 fL   MCH 31.0 26.0 - 34.0 pg   MCHC 33.2 30.0 - 36.0 g/dL   RDW 13.0 11.5 - 15.5 %   Platelets 247 150 - 400 K/uL   nRBC 0.0 0.0 - 0.2 %   Neutrophils Relative % 72 %   Neutro Abs 10.1 (H) 1.7 - 7.7 K/uL   Lymphocytes Relative 17 %   Lymphs Abs 2.4 0.7 - 4.0 K/uL   Monocytes Relative 6 %   Monocytes Absolute 0.8 0.1 - 1.0 K/uL   Eosinophils Relative 3 %   Eosinophils Absolute 0.4 0.0 - 0.5 K/uL   Basophils Relative 0 %   Basophils Absolute 0.0 0.0 - 0.1 K/uL   WBC Morphology See Note    nRBC 0 0 /100 WBC   Metamyelocytes Relative 1 %   Myelocytes 1 %   Abs Immature Granulocytes 0.30 (H) 0.00 - 0.07 K/uL   Polychromasia PRESENT   Comprehensive metabolic panel     Status: Abnormal   Collection Time: 01/13/20  5:11 PM  Result Value Ref Range   Sodium 136 135 - 145 mmol/L   Potassium 3.9 3.5 - 5.1 mmol/L   Chloride 105 98 - 111 mmol/L   CO2 19 (L) 22 - 32 mmol/L   Glucose, Bld 99 70 - 99 mg/dL   BUN 7 6 - 20 mg/dL   Creatinine, Ser 0.52 0.44 - 1.00 mg/dL   Calcium 9.0 8.9 - 10.3 mg/dL   Total Protein 6.5 6.5 - 8.1 g/dL   Albumin 2.6 (L) 3.5 - 5.0 g/dL   AST 28 15 - 41 U/L   ALT 19 0 - 44 U/L   Alkaline Phosphatase 98 38 - 126 U/L   Total Bilirubin 0.6 0.3 - 1.2 mg/dL   GFR calc non Af Amer >60 >60 mL/min   GFR calc Af Amer >60 >60 mL/min   Anion gap 12 5 - 15    MAU Course  Procedures  MDM EKG show NSR Patient has had 1L of IV fluids and is feeling better.   Assessment and Plan   1. Dizziness   2. [redacted] weeks gestation of pregnancy    DC home Comfort measures reviewed  3rd Trimester precautions  PTL precautions  Fetal kick counts RX: none  Return to MAU as needed FU with OB as planned  [place] [facility] Follow up.   Specialty: Obstetrics and Gynecology Contact information: [place] [id number] [place] DNP, CNM  01/13/20  6:43 PM

## 2020-01-13 NOTE — Discharge Instructions (Signed)
Third Trimester of Pregnancy The third trimester is from week 28 through week 40 (months 7 through 9). The third trimester is a time when the unborn baby (fetus) is growing rapidly. At the end of the ninth month, the fetus is about 20 inches in length and weighs 6-10 pounds. Body changes during your third trimester Your body will continue to go through many changes during pregnancy. The changes vary from woman to woman. During the third trimester:  Your weight will continue to increase. You can expect to gain 25-35 pounds (11-16 kg) by the end of the pregnancy.  You may begin to get stretch marks on your hips, abdomen, and breasts.  You may urinate more often because the fetus is moving lower into your pelvis and pressing on your bladder.  You may develop or continue to have heartburn. This is caused by increased hormones that slow down muscles in the digestive tract.  You may develop or continue to have constipation because increased hormones slow digestion and cause the muscles that push waste through your intestines to relax.  You may develop hemorrhoids. These are swollen veins (varicose veins) in the rectum that can itch or be painful.  You may develop swollen, bulging veins (varicose veins) in your legs.  You may have increased body aches in the pelvis, back, or thighs. This is due to weight gain and increased hormones that are relaxing your joints.  You may have changes in your hair. These can include thickening of your hair, rapid growth, and changes in texture. Some women also have hair loss during or after pregnancy, or hair that feels dry or thin. Your hair will most likely return to normal after your baby is born.  Your breasts will continue to grow and they will continue to become tender. A yellow fluid (colostrum) may leak from your breasts. This is the first milk you are producing for your baby.  Your belly button may stick out.  You may notice more swelling in your hands,  face, or ankles.  You may have increased tingling or numbness in your hands, arms, and legs. The skin on your belly may also feel numb.  You may feel short of breath because of your expanding uterus.  You may have more problems sleeping. This can be caused by the size of your belly, increased need to urinate, and an increase in your body's metabolism.  You may notice the fetus "dropping," or moving lower in your abdomen (lightening).  You may have increased vaginal discharge.  You may notice your joints feel loose and you may have pain around your pelvic bone. What to expect at prenatal visits You will have prenatal exams every 2 weeks until week 36. Then you will have weekly prenatal exams. During a routine prenatal visit:  You will be weighed to make sure you and the baby are growing normally.  Your blood pressure will be taken.  Your abdomen will be measured to track your baby's growth.  The fetal heartbeat will be listened to.  Any test results from the previous visit will be discussed.  You may have a cervical check near your due date to see if your cervix has softened or thinned (effaced).  You will be tested for Group B streptococcus. This happens between 35 and 37 weeks. Your health care provider may ask you:  What your birth plan is.  How you are feeling.  If you are feeling the baby move.  If you have had any abnormal   symptoms, such as leaking fluid, bleeding, severe headaches, or abdominal cramping.  If you are using any tobacco products, including cigarettes, chewing tobacco, and electronic cigarettes.  If you have any questions. Other tests or screenings that may be performed during your third trimester include:  Blood tests that check for low iron levels (anemia).  Fetal testing to check the health, activity level, and growth of the fetus. Testing is done if you have certain medical conditions or if there are problems during the pregnancy.  Nonstress test  (NST). This test checks the health of your baby to make sure there are no signs of problems, such as the baby not getting enough oxygen. During this test, a belt is placed around your belly. The baby is made to move, and its heart rate is monitored during movement. What is false labor? False labor is a condition in which you feel small, irregular tightenings of the muscles in the womb (contractions) that usually go away with rest, changing position, or drinking water. These are called Braxton Hicks contractions. Contractions may last for hours, days, or even weeks before true labor sets in. If contractions come at regular intervals, become more frequent, increase in intensity, or become painful, you should see your health care provider. What are the signs of labor?  Abdominal cramps.  Regular contractions that start at 10 minutes apart and become stronger and more frequent with time.  Contractions that start on the top of the uterus and spread down to the lower abdomen and back.  Increased pelvic pressure and dull back pain.  A watery or bloody mucus discharge that comes from the vagina.  Leaking of amniotic fluid. This is also known as your "water breaking." It could be a slow trickle or a gush. Let your health care provider know if it has a color or strange odor. If you have any of these signs, call your health care provider right away, even if it is before your due date. Follow these instructions at home: Medicines  Follow your health care provider's instructions regarding medicine use. Specific medicines may be either safe or unsafe to take during pregnancy.  Take a prenatal vitamin that contains at least 600 micrograms (mcg) of folic acid.  If you develop constipation, try taking a stool softener if your health care provider approves. Eating and drinking   Eat a balanced diet that includes fresh fruits and vegetables, whole grains, good sources of protein such as meat, eggs, or tofu,  and low-fat dairy. Your health care provider will help you determine the amount of weight gain that is right for you.  Avoid raw meat and uncooked cheese. These carry germs that can cause birth defects in the baby.  If you have low calcium intake from food, talk to your health care provider about whether you should take a daily calcium supplement.  Eat four or five small meals rather than three large meals a day.  Limit foods that are high in fat and processed sugars, such as fried and sweet foods.  To prevent constipation: ? Drink enough fluid to keep your urine clear or pale yellow. ? Eat foods that are high in fiber, such as fresh fruits and vegetables, whole grains, and beans. Activity  Exercise only as directed by your health care provider. Most women can continue their usual exercise routine during pregnancy. Try to exercise for 30 minutes at least 5 days a week. Stop exercising if you experience uterine contractions.  Avoid heavy lifting.  Do   not exercise in extreme heat or humidity, or at high altitudes.  Wear low-heel, comfortable shoes.  Practice good posture.  You may continue to have sex unless your health care provider tells you otherwise. Relieving pain and discomfort  Take frequent breaks and rest with your legs elevated if you have leg cramps or low back pain.  Take warm sitz baths to soothe any pain or discomfort caused by hemorrhoids. Use hemorrhoid cream if your health care provider approves.  Wear a good support bra to prevent discomfort from breast tenderness.  If you develop varicose veins: ? Wear support pantyhose or compression stockings as told by your healthcare provider. ? Elevate your feet for 15 minutes, 3-4 times a day. Prenatal care  Write down your questions. Take them to your prenatal visits.  Keep all your prenatal visits as told by your health care provider. This is important. Safety  Wear your seat belt at all times when driving.  Make  a list of emergency phone numbers, including numbers for family, friends, the hospital, and police and fire departments. General instructions  Avoid cat litter boxes and soil used by cats. These carry germs that can cause birth defects in the baby. If you have a cat, ask someone to clean the litter box for you.  Do not travel far distances unless it is absolutely necessary and only with the approval of your health care provider.  Do not use hot tubs, steam rooms, or saunas.  Do not drink alcohol.  Do not use any products that contain nicotine or tobacco, such as cigarettes and e-cigarettes. If you need help quitting, ask your health care provider.  Do not use any medicinal herbs or unprescribed drugs. These chemicals affect the formation and growth of the baby.  Do not douche or use tampons or scented sanitary pads.  Do not cross your legs for long periods of time.  To prepare for the arrival of your baby: ? Take prenatal classes to understand, practice, and ask questions about labor and delivery. ? Make a trial run to the hospital. ? Visit the hospital and tour the maternity area. ? Arrange for maternity or paternity leave through employers. ? Arrange for family and friends to take care of pets while you are in the hospital. ? Purchase a rear-facing car seat and make sure you know how to install it in your car. ? Pack your hospital bag. ? Prepare the baby's nursery. Make sure to remove all pillows and stuffed animals from the baby's crib to prevent suffocation.  Visit your dentist if you have not gone during your pregnancy. Use a soft toothbrush to brush your teeth and be gentle when you floss. Contact a health care provider if:  You are unsure if you are in labor or if your water has broken.  You become dizzy.  You have mild pelvic cramps, pelvic pressure, or nagging pain in your abdominal area.  You have lower back pain.  You have persistent nausea, vomiting, or  diarrhea.  You have an unusual or bad smelling vaginal discharge.  You have pain when you urinate. Get help right away if:  Your water breaks before 37 weeks.  You have regular contractions less than 5 minutes apart before 37 weeks.  You have a fever.  You are leaking fluid from your vagina.  You have spotting or bleeding from your vagina.  You have severe abdominal pain or cramping.  You have rapid weight loss or weight gain.  You have   shortness of breath with chest pain.  You notice sudden or extreme swelling of your face, hands, ankles, feet, or legs.  Your baby makes fewer than 10 movements in 2 hours.  You have severe headaches that do not go away when you take medicine.  You have vision changes. Summary  The third trimester is from week 28 through week 40, months 7 through 9. The third trimester is a time when the unborn baby (fetus) is growing rapidly.  During the third trimester, your discomfort may increase as you and your baby continue to gain weight. You may have abdominal, leg, and back pain, sleeping problems, and an increased need to urinate.  During the third trimester your breasts will keep growing and they will continue to become tender. A yellow fluid (colostrum) may leak from your breasts. This is the first milk you are producing for your baby.  False labor is a condition in which you feel small, irregular tightenings of the muscles in the womb (contractions) that eventually go away. These are called Braxton Hicks contractions. Contractions may last for hours, days, or even weeks before true labor sets in.  Signs of labor can include: abdominal cramps; regular contractions that start at 10 minutes apart and become stronger and more frequent with time; watery or bloody mucus discharge that comes from the vagina; increased pelvic pressure and dull back pain; and leaking of amniotic fluid. This information is not intended to replace advice given to you by your  health care provider. Make sure you discuss any questions you have with your health care provider. Document Revised: 11/25/2018 Document Reviewed: 09/09/2016 [employer] Patient Education  2020 [employer]  

## 2020-01-13 NOTE — MAU Note (Addendum)
Been really dizzy the last couple days, ears started ringing.  Started happening today while she was driving. Says had been drinking and had just eaten. Baby not moving as much, though while waiting he started moving.  "having a little pain in her abd"

## 2020-01-24 ENCOUNTER — Ambulatory Visit (INDEPENDENT_AMBULATORY_CARE_PROVIDER_SITE_OTHER): Payer: Medicaid Other | Admitting: Medical

## 2020-01-24 ENCOUNTER — Other Ambulatory Visit: Payer: Medicaid Other

## 2020-01-24 ENCOUNTER — Other Ambulatory Visit: Payer: Self-pay | Admitting: *Deleted

## 2020-01-24 ENCOUNTER — Other Ambulatory Visit: Payer: Self-pay

## 2020-01-24 ENCOUNTER — Encounter: Payer: Self-pay | Admitting: Medical

## 2020-01-24 VITALS — BP 115/65 | HR 99 | Wt 172.1 lb

## 2020-01-24 DIAGNOSIS — O26893 Other specified pregnancy related conditions, third trimester: Secondary | ICD-10-CM

## 2020-01-24 DIAGNOSIS — M549 Dorsalgia, unspecified: Secondary | ICD-10-CM

## 2020-01-24 DIAGNOSIS — Z3490 Encounter for supervision of normal pregnancy, unspecified, unspecified trimester: Secondary | ICD-10-CM

## 2020-01-24 DIAGNOSIS — Z3493 Encounter for supervision of normal pregnancy, unspecified, third trimester: Secondary | ICD-10-CM

## 2020-01-24 DIAGNOSIS — Z3A28 28 weeks gestation of pregnancy: Secondary | ICD-10-CM

## 2020-01-24 DIAGNOSIS — Z23 Encounter for immunization: Secondary | ICD-10-CM | POA: Diagnosis not present

## 2020-01-24 MED ORDER — COMFORT FIT MATERNITY SUPP LG MISC: 1.0000 [IU] | Freq: Every day | 0 refills | Status: DC | PRN

## 2020-01-24 NOTE — Patient Instructions (Addendum)
Braxton Hicks Contractions °Contractions of the uterus can occur throughout pregnancy, but they are not always a sign that you are in labor. You may have practice contractions called Braxton Hicks contractions. These false labor contractions are sometimes confused with true labor. °What are Braxton Hicks contractions? °Braxton Hicks contractions are tightening movements that occur in the muscles of the uterus before labor. Unlike true labor contractions, these contractions do not result in opening (dilation) and thinning of the cervix. Toward the end of pregnancy (32-34 weeks), Braxton Hicks contractions can happen more often and may become stronger. These contractions are sometimes difficult to tell apart from true labor because they can be very uncomfortable. You should not feel embarrassed if you go to the hospital with false labor. °Sometimes, the only way to tell if you are in true labor is for your health care provider to look for changes in the cervix. The health care provider will do a physical exam and may monitor your contractions. If you are not in true labor, the exam should show that your cervix is not dilating and your water has not broken. °If there are no other health problems associated with your pregnancy, it is completely safe for you to be sent home with false labor. You may continue to have Braxton Hicks contractions until you go into true labor. °How to tell the difference between true labor and false labor °True labor °· Contractions last 30-70 seconds. °· Contractions become very regular. °· Discomfort is usually felt in the top of the uterus, and it spreads to the lower abdomen and low back. °· Contractions do not go away with walking. °· Contractions usually become more intense and increase in frequency. °· The cervix dilates and gets thinner. °False labor °· Contractions are usually shorter and not as strong as true labor contractions. °· Contractions are usually irregular. °· Contractions  are often felt in the front of the lower abdomen and in the groin. °· Contractions may go away when you walk around or change positions while lying down. °· Contractions get weaker and are shorter-lasting as time goes on. °· The cervix usually does not dilate or become thin. °Follow these instructions at home: ° °· Take over-the-counter and prescription medicines only as told by your health care provider. °· Keep up with your usual exercises and follow other instructions from your health care provider. °· Eat and drink lightly if you think you are going into labor. °· If Braxton Hicks contractions are making you uncomfortable: °? Change your position from lying down or resting to walking, or change from walking to resting. °? Sit and rest in a tub of warm water. °? Drink enough fluid to keep your urine pale yellow. Dehydration may cause these contractions. °? Do slow and deep breathing several times an hour. °· Keep all follow-up prenatal visits as told by your health care provider. This is important. °Contact a health care provider if: °· You have a fever. °· You have continuous pain in your abdomen. °Get help right away if: °· Your contractions become stronger, more regular, and closer together. °· You have fluid leaking or gushing from your vagina. °· You pass blood-tinged mucus (bloody show). °· You have bleeding from your vagina. °· You have low back pain that you never had before. °· You feel your baby’s head pushing down and causing pelvic pressure. °· Your baby is not moving inside you as much as it used to. °Summary °· Contractions that occur before labor are   called Braxton Hicks contractions, false labor, or practice contractions.  Braxton Hicks contractions are usually shorter, weaker, farther apart, and less regular than true labor contractions. True labor contractions usually become progressively stronger and regular, and they become more frequent.  Manage discomfort from [facility] contractions  by changing position, resting in a warm bath, drinking plenty of water, or practicing deep breathing. This information is not intended to replace advice given to you by your health care provider. Make sure you discuss any questions you have with your health care provider. Document Revised: 07/17/2017 Document Reviewed: 12/18/2016 Elsevier Patient Education  [place]. Fetal Movement Counts Patient Name: ________________________________________________ Patient Due Date: ____________________ What is a fetal movement count?  A fetal movement count is the number of times that you feel your baby move during a certain amount of time. This may also be called a fetal kick count. A fetal movement count is recommended for every pregnant woman. You may be asked to start counting fetal movements as early as week 28 of your pregnancy. Pay attention to when your baby is most active. You may notice your baby's sleep and wake cycles. You may also notice things that make your baby move more. You should do a fetal movement count:  When your baby is normally most active.  At the same time each day. A good time to count movements is while you are resting, after having something to eat and drink. How do I count fetal movements? 1. Find a quiet, comfortable area. Sit, or lie down on your side. 2. Write down the date, the start time and stop time, and the number of movements that you felt between those two times. Take this information with you to your health care visits. 3. Write down your start time when you feel the first movement. 4. Count kicks, flutters, swishes, rolls, and jabs. You should feel at least 10 movements. 5. You may stop counting after you have felt 10 movements, or if you have been counting for 2 hours. Write down the stop time. 6. If you do not feel 10 movements in 2 hours, contact your health care provider for further instructions. Your health care provider may want to do additional tests  to assess your baby's well-being. Contact a health care provider if:  You feel fewer than 10 movements in 2 hours.  Your baby is not moving like he or she usually does. Date: ____________ Start time: ____________ Stop time: ____________ Movements: ____________ Date: ____________ Start time: ____________ Stop time: ____________ Movements: ____________ Date: ____________ Start time: ____________ Stop time: ____________ Movements: ____________ Date: ____________ Start time: ____________ Stop time: ____________ Movements: ____________ Date: ____________ Start time: ____________ Stop time: ____________ Movements: ____________ Date: ____________ Start time: ____________ Stop time: ____________ Movements: ____________ Date: ____________ Start time: ____________ Stop time: ____________ Movements: ____________ Date: ____________ Start time: ____________ Stop time: ____________ Movements: ____________ Date: ____________ Start time: ____________ Stop time: ____________ Movements: ____________ This information is not intended to replace advice given to you by your health care provider. Make sure you discuss any questions you have with your health care provider. Document Revised: 03/24/2019 Document Reviewed: 03/24/2019 Elsevier Patient Education  [place] [phone])  [facility] Davy Pique, MD; Gwendlyn Deutscher, MD; Walker Kehr, MD; Andria Frames, MD; McDiarmid, MD; Dutch Quint, MD; Nori Riis, MD; Mingo Amber, [address] [place], [place] [address] o [phone] o Mon-Fri 8:30-12:30, 1:30-5:00 o Providers come to see babies  at [facility] o Accepting [facility] at [place] o Limited providers who accept newborns: Docia Chuck, MD; Kateri Plummer, MD; Paulino Rily, MD o [address], [place], [place] [address] o [phone] o Mon-Fri 8:00-5:30 o Babies seen by providers at [facility] o Does NOT accept Medicaid o Please call early in hospitalization for appointment (limited availability)   [facility] Fatima Sanger, MD o [address], [place], [place] [address] o [phone] o Mon, Tue, Thur, Fri 8:30-5:00, Wed 10:00-7:00 (closed 1-2pm) o Babies seen by [facility] providers o Accepting Medicaid  Donnie Coffin - Pediatrician Fae Pippin, MD o [address], [place], [place] [address] o [phone] o Mon-Fri 8:30-5:00, Sat 8:30-12:00 o Provider comes to see babies at [facility] o Accepting Medicaid o Must have been referred from current patients or contacted office prior to delivery  [facility]) Leotis Pain, MD; Ave Filter, MD; Luna Fuse, MD; Kennedy Bucker, MD; Konrad Dolores, MD; Kathlene November, MD; Jenne Campus, MD; Lubertha South, MD; Wynetta Emery, MD; Duffy Rhody, MD; Gerre Couch, NP; Shirl Harris, NP o [address] [place], [place], [place] [address] o [phone] o Mon, Tue, Thur, Fri 8:30-5:30, Wed 9:30-5:30, Sat 8:30-12:30 o Babies seen by [facility] providers o Accepting Medicaid o Only accepting infants of first-time parents or siblings of current patients o Hospital discharge coordinator will make follow-up appointment  Cyril Mourning o [address] [place], [place]  [address] o [phone]   Fax - [phone]  [facility] o [address], [place], [place]  [address] o Phone - [phone]   Fax - [phone]  Lucio Edward o [address], [place], [place]  [address] o [phone]  [place] [phone])  [facility] Jorge Mandril, MD; Alita Chyle, MD; Princella Ion, MD; MD; Earlene Plater, MD; Jamesetta Orleans, MD; Alvera Novel, MD; Clarene Duke, MD; Rana Snare, MD; Carmon Ginsberg, MD; Alinda Money, MD; Hosie Poisson, MD; Mayford Knife, MD o [address] [place], [place] [address] o [phone] o Mon-Fri 8:30-5:00 (extended evenings Mon-Thur as needed), Sat-Sun 10:00-1:00 o Providers  come to see babies at [facility] o Accepting Medicaid for families of first-time babies and families with all children in the household age 71 and under. Must register with office prior to making appointment (M-F only).  [facility] Odella Aquas, NP; Lynelle Doctor, MD; Susann Givens, MD; [place], [place] o [address] [place], [place] [address] o [phone] o Mon-Fri 8:00-5:00 o Babies seen by providers at [facility] o Does NOT accept Medicaid/Commercial Insurance Only  [facility] at [place] ([facility])  Suzette Battiest, MD; Zachery Dauer, MD; Stefan Church, MD; Sabino Dick, MD; Quitman Livings, MD; Farris Has, MD; Gaynell Face, MD; Betha Loa, MD; Colon Flattery, MD; Clifton James, MD o [address] [place] [place], [place] [address] o [phone] o Mon-Fri 8:30-5:30, Sat (Oct.-Mar.) 9:00-1:00 o Babies seen by providers at [facility] o Accepting Medicaid  [place] [phone])  [facility] Dimitri, MD; Sheliah Hatch, MD o [address], [place], [place] [address] o [phone] o Mon-Fri 8:30-5:00, Sat 8:30-12:00 o Providers come to see babies at [facility] o Does NOT accept [facility] Cindy Hazy, [place]; Tracie Harrier, MD; [place], [place]; Wynelle Link, MD; Azucena Cecil, MD o [address] [place], [place] [address] o [phone] o Mon-Fri 8:00-5:00 o Babies seen by providers at [facility] o Does NOT accept Medicaid o Only accepting babies of parents who are patients o Please call  early in hospitalization for appointment (limited availability)  [facility] Lamar Benes, MD; Abran Cantor, MD; Early Osmond, MD; Cherre Huger, NP; Hyacinth Meeker, MD; Dwan Bolt, MD; Jarold Motto, NP; Dario Guardian, MD; Talmage Nap, MD; Maisie Fus, MD; Pricilla Holm, MD; Tama High, MD o [address], [place], [place] [address] o [phone] o Mon-Fri 8:00-5:00, Sat 9:00-12:00 o Providers come to see babies at [facility] o Does NOT accept [facility] [phone])  [facility] at [facility] [place] o Limited providers accepting new patients: Drema Pry, NP; Delena Serve, PA o [address], [place], [place] [address] o [phone] o Mon-Fri 8:00-5:00 o Babies seen by providers at [facility] o Does NOT accept Medicaid o Only accepting babies of parents who are patients o Please call early in hospitalization for appointment (limited availability)  [facility] Luan Pulling, MD; Nash Dimmer, MD o [address] [place] [place], [place] [address] o [phone] (press 1 to schedule appointment) o Mon-Fri 8:00-5:00 o Providers come to see babies at [facility] o Does NOT accept [facility] Cristino Martes, MD o [address], [place], [place] [address] o [phone] o Mon-Fri 8:30-5:00 (lunch 12:30-1:00), extended hours by appointment only Wed 5:00-6:30 o Babies seen by [facility] providers o Accepting Medicaid  [facility] Abbot, MD; [place], MD; Hassan Rowan, MD o [address] [place], [place], [place] [address] o [phone] o Mon-Fri 8:00-5:00 o Babies seen by [employer] providers o Does NOT accept Medicaid  [facility] [address] Elsworth Soho, MD; Durene Cal, MD; [place], DO o [address] [place], [place] [address] o [phone] o Mon-Fri 8:00-5:00 o Babies seen by [facility] providers o Does NOT accept [facility]  [facility] o [place], [place]; [place], [place]; [place], [place]; Avis Epley, MD; Vonna Kotyk, MD; Clance Boll, MD; Stevphen Rochester, NP; Arvilla Market, NP; Ann Maki, NP; Otis Dials, NP; Vaughan Basta, MD; Eartha Inch, MD o [address] [place], [place] [address] o [phone] o Mon-Fri 8:30-5:00, Sat 10:00-1:00 o Providers come to see babies at [facility] o Does NOT accept Medicaid o Free prenatal information session Tuesdays at 4:45pm  [facility] Luna Kitchens, MD; [place], [place]; [place], [place]; Valarie Cones, [place] o [address] [place]  [address] o [phone] o Mon-Fri 7:30-5:30 o Babies seen by [facility] providers  [facility] Doctor o [address], [place], [place]  [address] o [phone]   Fax - [phone]  [place] [phone] & [phone])  [facility] Alphonsa Overall, MD o [address] [place], [place] [address] o [phone] o Mon-Thur 8:00-6:00 o Providers come to see babies at [facility] o Accepting Medicaid  [facility] Zenon Mayo, NP; Cyndia Bent, MD; [place], [place]; [place], [place] o [address] [place], [place] [address] o [phone] o Mon-Thur 7:30-7:30, Fri 7:30-4:30 o Babies seen by [facility] providers o Accepting [facility] Cheryle Horsfall, MD; Janene Harvey, NP; Vonita Moss, MD o [address], [place], [place] [address] o [phone] o Mon-Fri 8:30-5:00, Sat 8:30-12:00 o Providers come to see babies at [facility] o Accepting Medicaid o Must have Meet & Greet appointment at office prior to delivery  [facility] [place] ([facility]) Llana Aliment, MD; Earlene Plater, MD; Lucretia Roers, MD o [address], [place], [place] [address] o [phone] o Mon-Wed 8:00-6:00, Thur-Fri 8:00-5:00, Sat 9:00-12:00 o Providers come to see babies at [facility] o Does NOT accept Medicaid o Only accepting siblings of current patients  [facility]  o [address], [place], [place]  [address] o [phone]   Fax - [phone]  [facility] at [facility] Jeanette o [id number], [place], [place]  [address] o [phone]   Fax - [phone]  [place] [phone] & [phone])  [facility] o [place], [place]; Glen JeanMatthews, DO o [address] [place], [place] [address] o [phone] o Mon-Fri 7:00-5:00 o Babies seen by [facility] providers o Does NOT accept  Medicaid  [facility] Ellis Savageo Briscoe, MD; [place], [place]; [place], [place] o [address], [place], [place] [address] o [phone] o Mon-Fri 8:00-5:00 o Babies seen by [facility] providers o Accepting Medicaid  [facility] - [address] Franne Fortso Boyd, MD; [place], [place]; PlessisJones, NP; [place], [place] o [address], [place], [place] [address] o [phone] o Mon-Fri 8:00-5:00 o Babies seen by providers at [facility] o Accepting [facility]  [place] [phone])  [facility] [place] Bunnello [place], [place] o [address] [place], [place] [address] o [phone] o Mon-Fri 8:00-5:00 o Babies seen by [facility] providers o Does NOT accept Medicaid o Limited availability, please call early in hospitalization to schedule follow-up  [facility] Jolee Ewingo Calderon, [place]; Eddie Candleummings, MD; Normand Sloopillard, MD; [place], [place]; Constance Goltzlson, MD; [place], [place] o [id number], [place], [place] [address] o [phone] o Mon-Fri 8:30-5:00, Sat 9:00-12:00 o Babies seen by providers at [facility] o Accepting Medicaid o Please register online then schedule online or call office o [email or web address]  [facility] - [facility]) Samuella Bruino Hunter, NP; Lucianne MussKumar, MD; Lanier ClamMartin Rogers, PA o [address], [place], [place] [address] o [phone] o Mon-Fri 8:00-5:00 o Babies seen by providers at [facility] o Accepting Medicaid  [facility] ([facility]) Sharin Monso Kingston, MD; Reed BreechKristi Fleenor, NP; Shelva MajesticWest, MD o [address], [place], [place] [address] o [phone] o Mon-Fri 8:00-5:30, Sat&Sun by appointment (phones open at 8:30) o Babies seen by [facility] providers o Accepting Medicaid o Must be a first-time baby or sibling of current patient  [facility] - [address]  o [address], [address], [place], [place]  [address] o [phone]   Fax - [phone]  [place] [phone] & [phone])  [facility] o [place], [place]; [place], [place]; Woody CreekRice, MD; [place], [place]; Carolyne FiscalSpry, MD o [address] [place], [place] [address] o [phone] o Mon-Thur 8:00-7:00, Fri 8:00-5:00, Sat 8:00-12:00, Sun 9:00-12:00 o Babies seen by [facility] providers o Accepting Medicaid  [facility] at [facility] o Coe-Goins, MD; Gaynell FaceMarshall, MD; [facility]-Louis, MD o [address], [place], [place] [address] o [phone] o Mon-Thur 8:00-5:00 o Babies seen by providers at [facility] o Accepting Medicaid  [facility] at [place] Gwenlyn Sarano Bratton, MD; Coe-Goins, MD; Madilyn FiremanHayes, MD; Melvyn NethLewis, MD; List, MD; Lazarus SalinesLott, MD; Gaynell FaceMarshall, MD; Berneda RoseMoran, MD; Flora Lipps'Neal, MD; Beryl MeagerPierre-Louis, MD; Luther RedoPitonzo, MD; Lavonia DraftsScholer, MD; Kellie SimmeringSpangle, MD o [address] [place], [place] [address] o [phone] o Mon-Fri 8:00-5:30, Sat (Oct.-Mar.) 9:00-1:00 o Babies seen by providers at [facility] o Accepting Medicaid o Must fill out new patient packet, available online at [email or web address]  [facility] - [address] [facility]) Simone Curiao Friddle, NP; Tiburcio PeaHarris, NP; Tresa EndoKelly, NP; Whitney PostLogan,  MD; Alinda Money, [place]; Hennie Duos, MD; Wynne Dust, MD; Kavin Leech, NP o [address], [place], [place] [address] o [phone] o Mon-Thur 8:00-5:30, Fri 8:00-5:00 o Babies seen by providers at [facility] o Accepting [facility]  [place] [phone])  [facility] o [place], [place]; [place], MD; [place], MD; [place], [place] o [address] [place], [place] [address] o [phone] o Mon-Fri 8:00-5:00 o Babies seen by providers at [facility] o Accepting [facility]   [place] [phone])  [facility] o [place], [place]; Lenise Arena, MD; [place], [place] o [address], [place], [place]  [address] o [phone] o Mon-Fri 8:00-5:00 o Babies seen by providers at [facility] o Does NOT accept Medicaid o Limited appointment availability, please call early in hospitalization   [facility] o [place], DO; [place], MD o [address] [place], [place] [address] o [phone] o Mon-Fri 8:00-5:00 o Babies seen by [facility] providers o Does NOT accept [facility] - [facility] Lorrine Kin, MD; Ninetta Lights, MD; [place], [place]; Horace, MD o [address], [place], [place] [address] o [phone] o Mon-Fri 8:00-5:00 o After hours clinic [facility] [place], [place] [address]) [phone] Mon-Fri 5:00-8:00, Sat 12:00-6:00, Sun 10:00-4:00 o Babies seen by [facility] providers o Accepting [facility] o [address], [place], [place]  [address] o [phone]   Fax - [phone]  [place] [phone])  Adult nurse HealthCare at [facility] Cleotilde Neer, MD o [address] [place], [place], [place] [address] o [phone] o Mon-Fri 8:00-5:00 o Babies seen by [facility] providers o Does NOT accept Medicaid  [facility] [place] [facility] [place]) Tomi Likens, MD o [address] [place] [address], [place], [place] [address] o [phone] o Mon-Thur 8:00-7:00, Fri 8:00-5:00, Sat 8:00-12:00 o Babies seen by providers at [facility] o Accepting Medicaid - but does not have vaccinations in office (must be received elsewhere) o Limited availability, please call early in hospitalization  [place] [phone])  [facility]  o Wyvonne Lenz, MD o [address], [place] [address] o [phone]  Fax [phone]  For maternity belt:  [facility]   [address] [place], [place] [address] Phone: [phone]  Monday      8:30AM-5PM Tuesday 8:30AM-5PM Wednesday 8:30AM-5PM Thursday 8:30AM-5PM Friday  8:30AM-5PM Saturday Closed Sunday Closed

## 2020-01-24 NOTE — Progress Notes (Signed)
   PRENATAL VISIT NOTE  Subjective:  Rachael Gilbert is a 19 y.o. G1P0000 at [redacted]w[redacted]d being seen today for ongoing prenatal care.  She is currently monitored for the following issues for this low-risk pregnancy and has Bacterial vaginosis; Morning sickness; Chlamydia trachomatis infection in mother during first trimester of pregnancy; and Supervision of low-risk pregnancy on their problem list.  Patient reports backache.  Contractions: Not present. Vag. Bleeding: None.  Movement: Present. Denies leaking of fluid.   The following portions of the patient's history were reviewed and updated as appropriate: allergies, current medications, past family history, past medical history, past social history, past surgical history and problem list.   Objective:   Vitals:   01/24/20 0921  BP: 115/65  Pulse: 99  Weight: 172 lb 1.6 oz (78.1 kg)    Fetal Status: Fetal Heart Rate (bpm): 147 Fundal Height: 30 cm Movement: Present     General:  Alert, oriented and cooperative. Patient is in no acute distress.  Skin: Skin is warm and dry. No rash noted.   Cardiovascular: Normal heart rate noted  Respiratory: Normal respiratory effort, no problems with respiration noted  Abdomen: Soft, gravid, appropriate for gestational age.  Pain/Pressure: Present     Pelvic: Cervical exam deferred        Extremities: Normal range of motion.  Edema: None  Mental Status: Normal mood and affect. Normal behavior. Normal judgment and thought content.   Assessment and Plan:  Pregnancy: G1P0000 at [redacted]w[redacted]d 1. Encounter for supervision of low-risk pregnancy in third trimester - 2 hour GTT, CBC, HIV, RPR today  - Tdap vaccine greater than or equal to 7yo IM - 52# TWG noted today, discussed health diet for pregnancy - Discussed Peds and importance of choosing Peds by 36 weeks   2. Back pain affecting pregnancy in third trimester - Elastic Bandages & Supports (COMFORT FIT MATERNITY SUPP LG) MISC; 1 Units by Does not apply route  daily as needed.  Dispense: 1 each; Refill: 0   Preterm labor symptoms and general obstetric precautions including but not limited to vaginal bleeding, contractions, leaking of fluid and fetal movement were reviewed in detail with the patient. Please refer to After Visit Summary for other counseling recommendations.   Return in about 2 weeks (around 02/07/2020) for LOB, Virtual.  Future Appointments  Date Time Provider Department Center  02/09/2020  9:55 AM Rasch, Harolyn Rutherford, NP [facility]    Vonzella Nipple, PA-C

## 2020-01-25 LAB — CBC
Hematocrit: 32 % — ABNORMAL LOW (ref 34.0–46.6)
Hemoglobin: 10.9 g/dL — ABNORMAL LOW (ref 11.1–15.9)
MCH: 30.4 pg (ref 26.6–33.0)
MCHC: 34.1 g/dL (ref 31.5–35.7)
MCV: 89 fL (ref 79–97)
Platelets: 231 10*3/uL (ref 150–450)
RBC: 3.58 x10E6/uL — ABNORMAL LOW (ref 3.77–5.28)
RDW: 12.9 % (ref 11.7–15.4)
WBC: 13.1 10*3/uL — ABNORMAL HIGH (ref 3.4–10.8)

## 2020-01-25 LAB — GLUCOSE TOLERANCE, 2 HOURS W/ 1HR
Glucose, 1 hour: 81 mg/dL (ref 65–179)
Glucose, 2 hour: 84 mg/dL (ref 65–152)
Glucose, Fasting: 71 mg/dL (ref 65–91)

## 2020-01-25 LAB — RPR: RPR Ser Ql: NONREACTIVE

## 2020-01-25 LAB — HIV ANTIBODY (ROUTINE TESTING W REFLEX): HIV Screen 4th Generation wRfx: NONREACTIVE

## 2020-02-06 ENCOUNTER — Encounter: Payer: Self-pay | Admitting: Student

## 2020-02-06 DIAGNOSIS — R12 Heartburn: Secondary | ICD-10-CM

## 2020-02-07 MED ORDER — FAMOTIDINE 20 MG PO TABS: 20.0000 mg | ORAL_TABLET | Freq: Two times a day (BID) | ORAL | 0 refills | Status: DC

## 2020-02-09 ENCOUNTER — Telehealth (INDEPENDENT_AMBULATORY_CARE_PROVIDER_SITE_OTHER): Payer: Medicaid Other | Admitting: Obstetrics and Gynecology

## 2020-02-09 ENCOUNTER — Other Ambulatory Visit: Payer: Self-pay

## 2020-02-09 VITALS — BP 127/73 | HR 101

## 2020-02-09 DIAGNOSIS — Z3493 Encounter for supervision of normal pregnancy, unspecified, third trimester: Secondary | ICD-10-CM

## 2020-02-09 DIAGNOSIS — Z3A30 30 weeks gestation of pregnancy: Secondary | ICD-10-CM

## 2020-02-09 NOTE — Progress Notes (Signed)
   TELEHEALTH OBSTETRICS VISIT ENCOUNTER NOTE  I connected with Rachael Gilbert on 02/09/20 at  9:55 AM EDT by telephone at home and verified that I am speaking with the correct person using two identifiers.   I discussed the limitations, risks, security and privacy concerns of performing an evaluation and management service by telephone and the availability of in person appointments. I also discussed with the patient that there may be a patient responsible charge related to this service. The patient expressed understanding and agreed to proceed.  Subjective:  Rachael Gilbert is a 19 y.o. G1P0000 at [redacted]w[redacted]d being followed for ongoing prenatal care.  She is currently monitored for the following issues for this low-risk pregnancy and has Bacterial vaginosis; Morning sickness; Chlamydia trachomatis infection in mother during first trimester of pregnancy; and Supervision of low-risk pregnancy on their problem list.  Patient reports no complaints. Reports fetal movement. Denies any contractions, bleeding or leaking of fluid.   The following portions of the patient's history were reviewed and updated as appropriate: allergies, current medications, past family history, past medical history, past social history, past surgical history and problem list.   Objective:   General:  Alert, oriented and cooperative.   Mental Status: Normal mood and affect perceived. Normal judgment and thought content.  Rest of physical exam deferred due to type of encounter  Assessment and Plan:  Pregnancy: G1P0000 at [redacted]w[redacted]d  1. Encounter for supervision of low-risk pregnancy in third trimester  BP 127/73 2 hour GTT normal  [place] on 5/4- baby was breech   Preterm labor symptoms and general obstetric precautions including but not limited to vaginal bleeding, contractions, leaking of fluid and fetal movement were reviewed in detail with the patient.  I discussed the assessment and treatment plan with the patient. The patient  was provided an opportunity to ask questions and all were answered. The patient agreed with the plan and demonstrated an understanding of the instructions. The patient was advised to call back or seek an in-person office evaluation/go to MAU at [facility] for any urgent or concerning symptoms. Please refer to After Visit Summary for other counseling recommendations.   I provided 9 minutes of non-face-to-face time during this encounter.  Return in about 2 weeks (around 02/23/2020) for For in person visit. .  Future Appointments  Date Time Provider Department Center  02/09/2020  9:55 AM Jaden Abreu, Harolyn Rutherford, NP [facility]    Venia Carbon, NP Center for [employer], [facility]

## 2020-02-09 NOTE — Progress Notes (Signed)
I connected with  [name] on 02/09/20 at  9:55 AM EDT by telephone and verified that I am speaking with the correct person using two identifiers.   I discussed the limitations, risks, security and privacy concerns of performing an evaluation and management service by telephone and the availability of in person appointments. I also discussed with the patient that there may be a patient responsible charge related to this service. The patient expressed understanding and agreed to proceed.  Ralene Bathe, RN 02/09/2020  9:25 AM

## 2020-02-17 ENCOUNTER — Inpatient Hospital Stay (HOSPITAL_COMMUNITY)
Admission: AD | Admit: 2020-02-17 | Discharge: 2020-02-17 | Disposition: A | Discharge status: Home / Self Care | Payer: Medicaid Other | Attending: Obstetrics and Gynecology | Admitting: Obstetrics and Gynecology

## 2020-02-17 ENCOUNTER — Encounter (HOSPITAL_COMMUNITY): Payer: Self-pay | Admitting: Obstetrics and Gynecology

## 2020-02-17 ENCOUNTER — Encounter: Payer: Self-pay | Admitting: Student

## 2020-02-17 ENCOUNTER — Other Ambulatory Visit: Payer: Self-pay

## 2020-02-17 DIAGNOSIS — R109 Unspecified abdominal pain: Secondary | ICD-10-CM | POA: Diagnosis not present

## 2020-02-17 DIAGNOSIS — O99513 Diseases of the respiratory system complicating pregnancy, third trimester: Secondary | ICD-10-CM | POA: Insufficient documentation

## 2020-02-17 DIAGNOSIS — Z87440 Personal history of urinary (tract) infections: Secondary | ICD-10-CM | POA: Insufficient documentation

## 2020-02-17 DIAGNOSIS — Z3A32 32 weeks gestation of pregnancy: Secondary | ICD-10-CM | POA: Diagnosis not present

## 2020-02-17 DIAGNOSIS — J45909 Unspecified asthma, uncomplicated: Secondary | ICD-10-CM | POA: Insufficient documentation

## 2020-02-17 DIAGNOSIS — O26893 Other specified pregnancy related conditions, third trimester: Secondary | ICD-10-CM | POA: Diagnosis present

## 2020-02-17 LAB — URINALYSIS, ROUTINE W REFLEX MICROSCOPIC
Bilirubin Urine: NEGATIVE
Glucose, UA: NEGATIVE mg/dL
Hgb urine dipstick: NEGATIVE
Ketones, ur: NEGATIVE mg/dL
Leukocytes,Ua: NEGATIVE
Nitrite: NEGATIVE
Protein, ur: NEGATIVE mg/dL
Specific Gravity, Urine: 1.015 (ref 1.005–1.030)
pH: 6 (ref 5.0–8.0)

## 2020-02-17 MED ORDER — ACETAMINOPHEN 500 MG PO TABS
1000.0000 mg | ORAL_TABLET | Freq: Once | ORAL | Status: AC
  Administered 2020-02-17: 1000 mg via ORAL
  Filled 2020-02-17: qty 2

## 2020-02-17 NOTE — MAU Note (Signed)
...  Rachael Gilbert is a 19 y.o. at [redacted]w[redacted]d here in MAU reporting: she began experiencing lower  abdominal pain around 1400 today while lying down that has been sharp/cramping. Pt reports she is nervous about the growth of the baby because she has "gained a lot of weight." +FM. No VB or LOF. Pt reports she did not take anything to relieve her pain. Pt reports experiencing lower abdominal pain throughout her pregnancy but it "always goes away." Pt reports wearing her support band when she works while sitting at home "whenever my back starts hurting and I remember to put it on."  Onset of complaint: 02/17/20 at 1400 Pain score: 8/10 lower abdominal FHT: 150 initial FHR Lab orders placed from triage: UA

## 2020-02-17 NOTE — MAU Provider Note (Signed)
History     CSN: [id number]  Arrival date and time: 02/17/20 1709   First Provider Initiated Contact with Patient 02/17/20 1909      Chief Complaint  Patient presents with  . Abdominal Pain   Rachael Gilbert is a 19 y.o. year old G4P0000 female at 31w0dweeks gestation who presents to MAU reporting lower abdominal pain since about 2 PM while lying down.  She describes the pain as sharp cramping; rated an 8 out of 10.  She has not taken any meds for the pain.  She also is concerned about her baby's growth because she has "gained a lot of weight."  She has a history of abdominal pain throughout this pregnancy and wears a support belt while she is working and mostly while she is at home.  She reports positive good fetal movement.  She denies vaginal bleeding or loss of fluid.  [facility]   OB History    Gravida  1   Para  0   Term  0   Preterm  0   AB  0   Living  0     SAB  0   TAB  0   Ectopic  0   Multiple  0   Live Births              Past Medical History:  Diagnosis Date  . Asthma   . Chlamydia infection   . UTI (urinary tract infection)   . Vitiligo     Past Surgical History:  Procedure Laterality Date  . NO PAST SURGERIES    . WISDOM TOOTH EXTRACTION      Family History  Problem Relation Age of Onset  . Bipolar disorder Mother   . Heart disease Mother   . Hypertension Mother   . Bipolar disorder Father     Social History   Tobacco Use  . Smoking status: Never Smoker  . Smokeless tobacco: Never Used  Vaping Use  . Vaping Use: Never used  Substance Use Topics  . Alcohol use: Not Currently  . Drug use: Not Currently    Types: Marijuana    Comment: last used in 2020    Allergies: No Known Allergies  Medications Prior to Admission  Medication Sig Dispense Refill Last Dose  . Blood Pressure Monitoring (BLOOD PRESSURE KIT) DEVI 1 Device by Does not  apply route as needed. 1 each 0 Past Month at Unknown time  . Elastic Bandages & Supports (COMFORT FIT MATERNITY SUPP LG) MISC 1 Units by Does not apply route daily as needed. 1 each 0 Past Week at Unknown time  . famotidine (PEPCID) 20 MG tablet Take 1 tablet (20 mg total) by mouth 2 (two) times daily. 60 tablet 0 02/16/2020 at Unknown time  . fexofenadine (ALLEGRA) 180 MG tablet Take 180 mg by mouth daily as needed for allergies.    Past Month at Unknown time  . prenatal vitamin w/FE, FA (NATACHEW) 29-1 MG CHEW chewable tablet Chew 1 tablet by mouth daily at 12 noon. 30 tablet 11 02/17/2020 at Unknown time    Review of Systems  Constitutional: Negative.   HENT: Negative.   Eyes: Negative.   Respiratory: Negative.   Cardiovascular: Negative.   Gastrointestinal: Negative.   Endocrine: Negative.   Genitourinary: Positive for pelvic pain (mid-pelvic pain).  Musculoskeletal: Negative.   Skin: Negative.   Allergic/Immunologic: Negative.  Neurological: Negative.   Hematological: Negative.   Psychiatric/Behavioral: Negative.    Physical Exam   Blood pressure 110/67, pulse (!) 111, temperature 98.5 F (36.9 C), temperature source Oral, resp. rate 15, height _0  (1.6 m), weight 82.1 kg, last menstrual period 07/14/2019, SpO2 100 %, unknown if currently breastfeeding.  Physical Exam Vitals and nursing note reviewed. Exam conducted with a chaperone present.  Constitutional:      Appearance: She is well-developed and normal weight.  HENT:     Head: Normocephalic and atraumatic.  Cardiovascular:     Rate and Rhythm: Normal rate.  Pulmonary:     Effort: Pulmonary effort is normal.  Genitourinary:    Comments: Fundal height = 33 cm Cervix: closed/thick/high Skin:    General: Skin is warm and dry.  Neurological:     Mental Status: She is alert and oriented to person, place, and time.  Psychiatric:        Mood and Affect: Mood normal.        Behavior: Behavior normal.    REACTIVE NST  - FHR: 135 bpm / moderate variability / accels present / decels absent / TOCO: none   MAU Course  Procedures  MDM CCUA Tylenol 1000 mg -- resolved pain  Results for orders placed or performed during the hospital encounter of 02/17/20 (from the past 24 hour(s))  Urinalysis, Routine w reflex microscopic     Status: Abnormal   Collection Time: 02/17/20  6:30 PM  Result Value Ref Range   Color, Urine YELLOW YELLOW   APPearance CLOUDY (A) CLEAR   Specific Gravity, Urine 1.015 1.005 - 1.030   pH 6.0 5.0 - 8.0   Glucose, UA NEGATIVE NEGATIVE mg/dL   Hgb urine dipstick NEGATIVE NEGATIVE   Bilirubin Urine NEGATIVE NEGATIVE   Ketones, ur NEGATIVE NEGATIVE mg/dL   Protein, ur NEGATIVE NEGATIVE mg/dL   Nitrite NEGATIVE NEGATIVE   Leukocytes,Ua NEGATIVE NEGATIVE    Assessment and Plan  Abdominal pain during pregnancy in third trimester - Plan: Discharge patient - Advised to take Tylenol 1000 mg every 6 hours prn pain - Reassurance given that FH is WNL - Explained that fetal well-being is good as per CEFM tracing - Explained there is no indication for any further testing or imaging during this emergency visit - Keep scheduled appt with [facility] - Patient verbalized an understanding of the plan of care and agrees.   Rachael Deep, MSN, CNM 02/17/2020, 7:09 PM

## 2020-02-17 NOTE — Discharge Instructions (Signed)
Continue wearing your maternity support belt as previously prescribed

## 2020-02-28 ENCOUNTER — Encounter: Payer: Self-pay | Admitting: Medical

## 2020-02-28 ENCOUNTER — Other Ambulatory Visit: Payer: Self-pay

## 2020-02-28 ENCOUNTER — Ambulatory Visit (INDEPENDENT_AMBULATORY_CARE_PROVIDER_SITE_OTHER): Payer: Medicaid Other | Admitting: Medical

## 2020-02-28 VITALS — BP 107/71 | HR 139 | Wt 184.0 lb

## 2020-02-28 DIAGNOSIS — K649 Unspecified hemorrhoids: Secondary | ICD-10-CM

## 2020-02-28 DIAGNOSIS — O2243 Hemorrhoids in pregnancy, third trimester: Secondary | ICD-10-CM

## 2020-02-28 DIAGNOSIS — Z3A33 33 weeks gestation of pregnancy: Secondary | ICD-10-CM

## 2020-02-28 DIAGNOSIS — Z3493 Encounter for supervision of normal pregnancy, unspecified, third trimester: Secondary | ICD-10-CM

## 2020-02-28 MED ORDER — LIDOCAINE 5 % EX OINT: 1.0000 "application " | TOPICAL_OINTMENT | CUTANEOUS | 0 refills | Status: DC | PRN

## 2020-02-28 MED ORDER — WITCH HAZEL-GLYCERIN EX PADS: 1.0000 "application " | MEDICATED_PAD | CUTANEOUS | 12 refills | Status: DC | PRN

## 2020-02-28 NOTE — Progress Notes (Signed)
   PRENATAL VISIT NOTE  Subjective:  Rachael Gilbert is a 19 y.o. G1P0000 at [redacted]w[redacted]d being seen today for ongoing prenatal care.  She is currently monitored for the following issues for this low-risk pregnancy and has Bacterial vaginosis; Morning sickness; Chlamydia trachomatis infection in mother during first trimester of pregnancy; and Supervision of low-risk pregnancy on their problem list.  Patient reports occasional contractions.  Contractions: Not present. Vag. Bleeding: None.  Movement: Present. Denies leaking of fluid.   The following portions of the patient's history were reviewed and updated as appropriate: allergies, current medications, past family history, past medical history, past social history, past surgical history and problem list.   Objective:   Vitals:   02/28/20 1122  BP: 107/71  Pulse: (!) 139  Weight: 184 lb (83.5 kg)    Fetal Status: Fetal Heart Rate (bpm): 150 Fundal Height: 33 cm Movement: Present     General:  Alert, oriented and cooperative. Patient is in no acute distress.  Skin: Skin is warm and dry. No rash noted.   Cardiovascular: Normal heart rate noted  Respiratory: Normal respiratory effort, no problems with respiration noted  Abdomen: Soft, gravid, appropriate for gestational age.  Pain/Pressure: Absent     Pelvic: Cervical exam deferred        Extremities: Normal range of motion.  Edema: None  Mental Status: Normal mood and affect. Normal behavior. Normal judgment and thought content.   Assessment and Plan:  Pregnancy: G1P0000 at [redacted]w[redacted]d 1. Encounter for supervision of low-risk pregnancy in third trimester - Doing well - Planning to use TAPM for Peds - N/V has improved, no longer taking anti-emetics   2. Hemorrhoids, unspecified hemorrhoid type - witch hazel-glycerin (TUCKS) pad; Apply 1 application topically as needed for itching.  Dispense: 40 each; Refill: 12 - lidocaine (XYLOCAINE) 5 % ointment; Apply 1 application topically as needed.   Dispense: 35.44 g; Refill: 0 - Encouraged increased PO hydration and high fiber diet to promote soft, easy to pass BMs  Preterm labor symptoms and general obstetric precautions including but not limited to vaginal bleeding, contractions, leaking of fluid and fetal movement were reviewed in detail with the patient. Please refer to After Visit Summary for other counseling recommendations.   Return in about 2 weeks (around 03/13/2020) for LOB, any provider.  No future appointments.  Vonzella Nipple, PA-C

## 2020-02-28 NOTE — Patient Instructions (Signed)
Fetal Movement Counts Patient Name: ________________________________________________ Patient Due Date: ____________________ What is a fetal movement count?  A fetal movement count is the number of times that you feel your baby move during a certain amount of time. This may also be called a fetal kick count. A fetal movement count is recommended for every pregnant woman. You may be asked to start counting fetal movements as early as week 28 of your pregnancy. Pay attention to when your baby is most active. You may notice your baby's sleep and wake cycles. You may also notice things that make your baby move more. You should do a fetal movement count:  When your baby is normally most active.  At the same time each day. A good time to count movements is while you are resting, after having something to eat and drink. How do I count fetal movements? 1. Find a quiet, comfortable area. Sit, or lie down on your side. 2. Write down the date, the start time and stop time, and the number of movements that you felt between those two times. Take this information with you to your health care visits. 3. Write down your start time when you feel the first movement. 4. Count kicks, flutters, swishes, rolls, and jabs. You should feel at least 10 movements. 5. You may stop counting after you have felt 10 movements, or if you have been counting for 2 hours. Write down the stop time. 6. If you do not feel 10 movements in 2 hours, contact your health care provider for further instructions. Your health care provider may want to do additional tests to assess your baby's well-being. Contact a health care provider if:  You feel fewer than 10 movements in 2 hours.  Your baby is not moving like he or she usually does. Date: ____________ Start time: ____________ Stop time: ____________ Movements: ____________ Date: ____________ Start time: ____________ Stop time: ____________ Movements: ____________ Date: ____________  Start time: ____________ Stop time: ____________ Movements: ____________ Date: ____________ Start time: ____________ Stop time: ____________ Movements: ____________ Date: ____________ Start time: ____________ Stop time: ____________ Movements: ____________ Date: ____________ Start time: ____________ Stop time: ____________ Movements: ____________ Date: ____________ Start time: ____________ Stop time: ____________ Movements: ____________ Date: ____________ Start time: ____________ Stop time: ____________ Movements: ____________ Date: ____________ Start time: ____________ Stop time: ____________ Movements: ____________ This information is not intended to replace advice given to you by your health care provider. Make sure you discuss any questions you have with your health care provider. Document Revised: 03/24/2019 Document Reviewed: 03/24/2019 Elsevier Patient Education  2020 [employer] Braxton Hicks Contractions Contractions of the uterus can occur throughout pregnancy, but they are not always a sign that you are in labor. You may have practice contractions called Braxton Hicks contractions. These false labor contractions are sometimes confused with true labor. What are Braxton Hicks contractions? Braxton Hicks contractions are tightening movements that occur in the muscles of the uterus before labor. Unlike true labor contractions, these contractions do not result in opening (dilation) and thinning of the cervix. Toward the end of pregnancy (32-34 weeks), Braxton Hicks contractions can happen more often and may become stronger. These contractions are sometimes difficult to tell apart from true labor because they can be very uncomfortable. You should not feel embarrassed if you go to the hospital with false labor. Sometimes, the only way to tell if you are in true labor is for your health care provider to look for changes in the cervix. The health care provider   will do a physical exam and may  monitor your contractions. If you are not in true labor, the exam should show that your cervix is not dilating and your water has not broken. If there are no other health problems associated with your pregnancy, it is completely safe for you to be sent home with false labor. You may continue to have Braxton Hicks contractions until you go into true labor. How to tell the difference between true labor and false labor True labor  Contractions last 30-70 seconds.  Contractions become very regular.  Discomfort is usually felt in the top of the uterus, and it spreads to the lower abdomen and low back.  Contractions do not go away with walking.  Contractions usually become more intense and increase in frequency.  The cervix dilates and gets thinner. False labor  Contractions are usually shorter and not as strong as true labor contractions.  Contractions are usually irregular.  Contractions are often felt in the front of the lower abdomen and in the groin.  Contractions may go away when you walk around or change positions while lying down.  Contractions get weaker and are shorter-lasting as time goes on.  The cervix usually does not dilate or become thin. Follow these instructions at home:   Take over-the-counter and prescription medicines only as told by your health care provider.  Keep up with your usual exercises and follow other instructions from your health care provider.  Eat and drink lightly if you think you are going into labor.  If Braxton Hicks contractions are making you uncomfortable: ? Change your position from lying down or resting to walking, or change from walking to resting. ? Sit and rest in a tub of warm water. ? Drink enough fluid to keep your urine pale yellow. Dehydration may cause these contractions. ? Do slow and deep breathing several times an hour.  Keep all follow-up prenatal visits as told by your health care provider. This is important. Contact a  health care provider if:  You have a fever.  You have continuous pain in your abdomen. Get help right away if:  Your contractions become stronger, more regular, and closer together.  You have fluid leaking or gushing from your vagina.  You pass blood-tinged mucus (bloody show).  You have bleeding from your vagina.  You have low back pain that you never had before.  You feel your baby's head pushing down and causing pelvic pressure.  Your baby is not moving inside you as much as it used to. Summary  Contractions that occur before labor are called Braxton Hicks contractions, false labor, or practice contractions.  Braxton Hicks contractions are usually shorter, weaker, farther apart, and less regular than true labor contractions. True labor contractions usually become progressively stronger and regular, and they become more frequent.  Manage discomfort from Braxton Hicks contractions by changing position, resting in a warm bath, drinking plenty of water, or practicing deep breathing. This information is not intended to replace advice given to you by your health care provider. Make sure you discuss any questions you have with your health care provider. Document Revised: 07/17/2017 Document Reviewed: 12/18/2016 Elsevier Patient Education  2020 Elsevier Inc.  

## 2020-03-07 ENCOUNTER — Encounter: Payer: Self-pay | Admitting: Student

## 2020-03-07 DIAGNOSIS — R12 Heartburn: Secondary | ICD-10-CM

## 2020-03-07 MED ORDER — FAMOTIDINE 20 MG PO TABS: 20.0000 mg | ORAL_TABLET | Freq: Two times a day (BID) | ORAL | 0 refills | Status: DC

## 2020-03-13 ENCOUNTER — Ambulatory Visit (INDEPENDENT_AMBULATORY_CARE_PROVIDER_SITE_OTHER): Payer: Medicaid Other | Admitting: Student

## 2020-03-13 ENCOUNTER — Other Ambulatory Visit: Payer: Self-pay

## 2020-03-13 VITALS — BP 110/74 | HR 121 | Temp 95.0°F | Wt 188.9 lb

## 2020-03-13 DIAGNOSIS — Z3009 Encounter for other general counseling and advice on contraception: Secondary | ICD-10-CM

## 2020-03-13 DIAGNOSIS — O99613 Diseases of the digestive system complicating pregnancy, third trimester: Secondary | ICD-10-CM

## 2020-03-13 DIAGNOSIS — Z3493 Encounter for supervision of normal pregnancy, unspecified, third trimester: Secondary | ICD-10-CM

## 2020-03-13 DIAGNOSIS — K649 Unspecified hemorrhoids: Secondary | ICD-10-CM

## 2020-03-13 DIAGNOSIS — Z3A35 35 weeks gestation of pregnancy: Secondary | ICD-10-CM

## 2020-03-13 MED ORDER — LIDOCAINE 5 % EX OINT: 1.0000 "application " | TOPICAL_OINTMENT | CUTANEOUS | 2 refills | Status: DC | PRN

## 2020-03-13 NOTE — Patient Instructions (Addendum)
We will start visits every week from now on.    Tests and Screening During Pregnancy Having certain tests and screenings during pregnancy is an important part of your prenatal care. These tests help your health care provider find problems that might affect your pregnancy. Some tests are done for all pregnant women, and some are optional. Most of the tests and screenings do not pose any risks for you or your baby. You may need additional testing if any routine tests indicate a problem. Tests and screenings done in Rosmarie Esquibel pregnancy Some tests and screenings you can expect to have in Marlaya Turck pregnancy include:  Blood tests, such as: ? Complete blood count (CBC). This test is done to check your red and white blood cells. It can help identify a risk for anemia, infection, or bleeding. ? Blood typing. This test determines your blood type as well as whether you have a certain protein in your red blood cells (Rh factor). If you do not have this protein (Rh negative) and your baby does have it (Rh positive), your body could make antibodies to the Rh factor. This could be dangerous to your baby's health. ? Tests to check for diseases that can cause birth defects or can be passed to your baby, such as:  [place] measles (rubella). The test indicates whether you are immune to rubella.  Hepatitis B and C. All women are tested for hepatitis B. You may also be tested for hepatitis C if you have risk factors for the condition.  Zika virus infection. You may have a blood or urine test to check for this infection if you or your partner has traveled to an area where the virus occurs.  Urine testing. A urine sample can be tested for diabetes, protein in your urine, and signs of infection.  Testing for sexually transmitted infections (STIs), such as HIV, syphilis, and chlamydia.  Testing for tuberculosis. You may have this skin test if you are at risk for tuberculosis.  Fetal ultrasound. This is an imaging study of  your developing baby. It is done using sound waves and a computer. This test may be done at 11-14 weeks to confirm your pregnancy and help determine your due date. Tests and screenings done later in pregnancy Certain tests are done for the first time during later pregnancy. In addition, some of the tests that were done in Florita Nitsch pregnancy are repeated at this time. Some common tests you can expect to have later in pregnancy include:  Rh antibody testing. If you are Rh negative, you will have a blood test at about 28 weeks of pregnancy to see if you are producing Rh antibodies. If you have not started to make antibodies, you will be given an injection to prevent you from making antibodies for the rest of your pregnancy.  Glucose screening. This tests your blood sugar to find out whether you are developing the type of diabetes that occurs during pregnancy (gestational diabetes). You may have this screening earlier if you have risk factors for diabetes.  Screening for group B streptococcus (GBS). GBS is a type of bacteria that may live in your rectum or vagina. You may have GBS without any symptoms. GBS can spread to your baby during birth. This test involves doing a rectal and vaginal swab at 35-37 weeks of pregnancy. If testing is positive for GBS, you may be treated with antibiotic medicine.  CBC to check for anemia and blood-clotting ability.  Urine tests to check for protein, which can  be a sign of a condition called preeclampsia.  Fetal ultrasound. This may be repeated at 16-20 weeks to check how your baby is growing and developing. Screening for birth defects Some birth defects are caused by abnormal genes passed down through families. Cindee Mclester in your pregnancy, tests can be done to find out if your baby is at risk for a genetic disorder. This testing is optional. The type of testing recommended for you will depend on your family and medical history, your ethnicity, and your age. Testing may  include:  Screening tests. These tests may include an ultrasound, blood tests, or a combination of both. The blood tests are used to check for abnormal genes, and the ultrasound is done to look for Sayuri Rhames birth defects.  Carrier screening. This test involves checking the blood or saliva of both parents to see if they carry abnormal genes that could be passed down to a baby. If genetic screening shows that your baby is at risk for a genetic defect, additional diagnostic testing may be recommended, such as:  Amniocentesis. This involves testing a sample of fluid from your womb (amniotic fluid).  Chorionic villus sampling. In this test, a sample of cells from your placenta is checked for abnormal cells. Unlike other tests done during pregnancy, diagnostic testing does have some risk for your pregnancy. Talk to your health care provider about the risks and benefits of genetic testing. Where to find more information  American Pregnancy Association: americanpregnancy.org/prenatal-testing  Office on Women's Health: [employer].co.nz  March of Dimes: marchofdimes.org/pregnancy Questions to ask your health care provider  What routine tests are recommended for me?  When and how will these tests be done?  When will I get the results of routine tests?  What do the results of these tests mean for me or my baby?  Do you recommend any genetic screening tests? Which ones?  Should I see a genetic counselor before having genetic screening? Summary  Having tests and screenings during pregnancy is an important part of your prenatal care.  In Tylerjames Hoglund pregnancy, testing may be done to check blood type, Rh status, and risks for various conditions that can affect your baby.  Fetal ultrasound may be done in Lonn Im pregnancy to confirm a pregnancy and later to look for any birth defects.  Later in pregnancy, tests may include screening for GBS and gestational diabetes.  Genetic testing is  optional. Consider talking to a genetic counselor about this testing. This information is not intended to replace advice given to you by your health care provider. Make sure you discuss any questions you have with your health care provider. Document Revised: 11/24/2018 Document Reviewed: 10/19/2017 Elsevier Patient Education  2020 [employer].

## 2020-03-13 NOTE — Progress Notes (Addendum)
   PRENATAL VISIT NOTE  Subjective:  Rachael Gilbert is a 19 y.o. G1P0000 at [redacted]w[redacted]d being seen today for ongoing prenatal care.  She is currently monitored for the following issues for this low-risk pregnancy and has Bacterial vaginosis; Morning sickness; Chlamydia trachomatis infection in mother during first trimester of pregnancy; and Supervision of low-risk pregnancy on their problem list.  Patient reports occasional contractions.  Contractions: Irritability. Vag. Bleeding: None.  Movement: Present. Denies leaking of fluid.   The following portions of the patient's history were reviewed and updated as appropriate: allergies, current medications, past family history, past medical history, past social history, past surgical history and problem list. Problem list updated.  Objective:   Vitals:   03/13/20 1111  BP: 110/74  Pulse: (!) 121  Temp: (!) 95 F (35 C)  Weight: 188 lb 14.4 oz (85.7 kg)    Fetal Status: Fetal Heart Rate (bpm): 146 Fundal Height: 36 cm Movement: Present     General:  Alert, oriented and cooperative. Patient is in no acute distress.  Skin: Skin is warm and dry. No rash noted.   Cardiovascular: Normal heart rate noted  Respiratory: Normal respiratory effort, no problems with respiration noted  Abdomen: Soft, gravid, appropriate for gestational age.  Pain/Pressure: Present     Pelvic: Cervical exam deferred        Extremities: Normal range of motion.  Edema: None  Mental Status:  Normal mood and affect. Normal behavior. Normal judgment and thought content.   Assessment and Plan:  Pregnancy: G1P0000 at [redacted]w[redacted]d 1. Encounter for supervision of low-risk pregnancy in third trimester Anticipatory guidance for 36 week visit given, including GBS testing and treatment. Discussed normal weight gain associated with pregnancy. Discussed average size of the baby at this time during pregnancy Educated patient on walking 20 minutes per day before and after delivery to help reduce  excess weight. Discussed the birth control methods after delivery and provided information on Nexplanon vs. Depo Provera vs. IUD. Discussed breast feeding vs. Bottle feeding.  Discussed warning signs of pre-eclampsia, elevated BP, and warning signs that would warrant going to the hospital for evaluation.  Preterm labor symptoms and general obstetric precautions including but not limited to vaginal bleeding, contractions, leaking of fluid and fetal movement were reviewed in detail with the patient. All questions answered.   2. Hemorrhoids, unspecified hemorrhoid type Discussed measures to help prevent constipation and hemorrhoids including increased water consumption and avoiding straining and sitting on the toilet for long periods of time.  - lidocaine (XYLOCAINE) 5 % ointment; Apply 1 application topically as needed.  Dispense: 35.44 g; Refill: 2   Please refer to After Visit Summary for other counseling recommendations.  Return in about 1 week (around 03/20/2020), or LROB and cultures.   Tollie Eth, NP

## 2020-03-23 ENCOUNTER — Other Ambulatory Visit: Payer: Self-pay

## 2020-03-23 ENCOUNTER — Ambulatory Visit (INDEPENDENT_AMBULATORY_CARE_PROVIDER_SITE_OTHER): Payer: Medicaid Other | Admitting: Obstetrics and Gynecology

## 2020-03-23 ENCOUNTER — Other Ambulatory Visit (HOSPITAL_COMMUNITY)
Admission: RE | Admit: 2020-03-23 | Discharge: 2020-03-23 | Disposition: A | Discharge status: Home / Self Care | Payer: Medicaid Other | Source: Ambulatory Visit | Attending: Obstetrics and Gynecology | Admitting: Obstetrics and Gynecology

## 2020-03-23 ENCOUNTER — Encounter: Payer: Self-pay | Admitting: Obstetrics and Gynecology

## 2020-03-23 VITALS — BP 128/73 | HR 98 | Wt 190.0 lb

## 2020-03-23 DIAGNOSIS — Z3A37 37 weeks gestation of pregnancy: Secondary | ICD-10-CM

## 2020-03-23 DIAGNOSIS — Z3493 Encounter for supervision of normal pregnancy, unspecified, third trimester: Secondary | ICD-10-CM | POA: Diagnosis not present

## 2020-03-23 DIAGNOSIS — A568 Sexually transmitted chlamydial infection of other sites: Secondary | ICD-10-CM

## 2020-03-23 DIAGNOSIS — O98311 Other infections with a predominantly sexual mode of transmission complicating pregnancy, first trimester: Secondary | ICD-10-CM

## 2020-03-23 NOTE — Progress Notes (Signed)
   PRENATAL VISIT NOTE  Subjective:  Rachael Gilbert is a 19 y.o. G1P0000 at [redacted]w[redacted]d being seen today for ongoing prenatal care. She is currently monitored for the following issues for this low-risk pregnancy and has Bacterial vaginosis; Morning sickness; Chlamydia trachomatis infection in mother during first trimester of pregnancy; Supervision of low-risk pregnancy; and [redacted] weeks gestation of pregnancy on their problem list.  Patient reports intermittent contractions and slight nausea on several days.  Contractions: Irritability. Vag. Bleeding: None.  Movement: Present. Denies leaking of fluid. No mood or safety concerns reported today.  Pt desires to breastfeed. Still undecided regarding contraception but strongly considering Depo vs Nexplanon. Pt reports prior use of Nexplanon with side effect of undesired weight loss.  The following portions of the patient's history were reviewed and updated as appropriate: allergies, current medications, past family history, past medical history, past social history, past surgical history and problem list.  Objective:   Vitals:   03/23/20 0843  BP: 128/73  Pulse: 98  Weight: 190 lb (86.2 kg)    Fetal Status: Fetal Heart Rate (bpm): 141 Fundal Height: 38 cm Movement: Present     General:  Alert, oriented and cooperative. Patient is in no acute distress.  Skin: Skin is warm and dry. No rash noted.   Cardiovascular: Normal heart rate noted  Respiratory: Normal respiratory effort, no problems with respiration noted  Abdomen: Soft, gravid, appropriate for gestational age.  No pain/Pressure to palpation. Vertex on Leopold's.  Pelvic: Cervical exam deferred      External GU exam without visible lesions.  Extremities: Normal range of motion.  Edema: None  Mental Status: Normal mood and affect. Normal behavior. Normal judgment and thought content.   Assessment and Plan:  Pregnancy: G1P0000 at [redacted]w[redacted]d 1. Encounter for supervision of low-risk pregnancy in  third trimester, [redacted] weeks GA: Most recent US @[redacted]w[redacted]d  given limited views with anatomy scan showed complete views with normal interval growth and normal amniotic fluid. No red flag symptoms today. +FHTs and appropriate fundal height. Vertex on Leopold's. - GC/Chlamydia probe amp ([place])not at [facility] - Culture, beta strep (group b only) - plan for f/u prenatal appt in 1 week if not yet delivered - counseled on preeclampsia symptoms given G1P0 at young age  Term labor symptoms and general obstetric precautions including but not limited to vaginal bleeding, contractions, leaking of fluid and fetal movement were reviewed in detail with the patient. Please refer to After Visit Summary for other counseling recommendations.   2. History of chlamydia in pregnancy, first trimester: Prior negative TOC on 12/27/19. Pt denies GU symptoms today. - repeat GC/Chlamydia today as noted above  Return in about 1 week (around 03/30/2020) for in person OB, any provider.  Future Appointments  Date Time Provider Department Center  04/02/2020  1:00 PM 04/04/2020, NP [facility]  04/09/2020  1:00 PM 04/11/2020 [facility]  04/16/2020  1:15 PM 04/18/2020, CNM [facility]    [facility] MD OB Fellow, Faculty Practice

## 2020-03-23 NOTE — Patient Instructions (Signed)
Good to meet you today! We tested for Group B strep today and also STIs (gonorrhea and chlamydia).  Third Trimester of Pregnancy The third trimester is from week 28 through week 40 (months 7 through 9). The third trimester is a time when the unborn baby (fetus) is growing rapidly. At the end of the ninth month, the fetus is about 20 inches in length and weighs 6-10 pounds. Body changes during your third trimester Your body will continue to go through many changes during pregnancy. The changes vary from woman to woman. During the third trimester:  Your weight will continue to increase. You can expect to gain 25-35 pounds (11-16 kg) by the end of the pregnancy.  You may begin to get stretch marks on your hips, abdomen, and breasts.  You may urinate more often because the fetus is moving lower into your pelvis and pressing on your bladder.  You may develop or continue to have heartburn. This is caused by increased hormones that slow down muscles in the digestive tract.  You may develop or continue to have constipation because increased hormones slow digestion and cause the muscles that push waste through your intestines to relax.  You may develop hemorrhoids. These are swollen veins (varicose veins) in the rectum that can itch or be painful.  You may develop swollen, bulging veins (varicose veins) in your legs.  You may have increased body aches in the pelvis, back, or thighs. This is due to weight gain and increased hormones that are relaxing your joints.  You may have changes in your hair. These can include thickening of your hair, rapid growth, and changes in texture. Some women also have hair loss during or after pregnancy, or hair that feels dry or thin. Your hair will most likely return to normal after your baby is born.  Your breasts will continue to grow and they will continue to become tender. A yellow fluid (colostrum) may leak from your breasts. This is the first milk you are  producing for your baby.  Your belly button may stick out.  You may notice more swelling in your hands, face, or ankles.  You may have increased tingling or numbness in your hands, arms, and legs. The skin on your belly may also feel numb.  You may feel short of breath because of your expanding uterus.  You may have more problems sleeping. This can be caused by the size of your belly, increased need to urinate, and an increase in your body's metabolism.  You may notice the fetus "dropping," or moving lower in your abdomen (lightening).  You may have increased vaginal discharge.  You may notice your joints feel loose and you may have pain around your pelvic bone. What to expect at prenatal visits You will have prenatal exams every 2 weeks until week 36. Then you will have weekly prenatal exams. During a routine prenatal visit:  You will be weighed to make sure you and the baby are growing normally.  Your blood pressure will be taken.  Your abdomen will be measured to track your baby's growth.  The fetal heartbeat will be listened to.  Any test results from the previous visit will be discussed.  You may have a cervical check near your due date to see if your cervix has softened or thinned (effaced).  You will be tested for Group B streptococcus. This happens between 35 and 37 weeks. Your health care provider may ask you:  What your birth plan is.  How you are feeling.  If you are feeling the baby move.  If you have had any abnormal symptoms, such as leaking fluid, bleeding, severe headaches, or abdominal cramping.  If you are using any tobacco products, including cigarettes, chewing tobacco, and electronic cigarettes.  If you have any questions. Other tests or screenings that may be performed during your third trimester include:  Blood tests that check for low iron levels (anemia).  Fetal testing to check the health, activity level, and growth of the fetus. Testing is  done if you have certain medical conditions or if there are problems during the pregnancy.  Nonstress test (NST). This test checks the health of your baby to make sure there are no signs of problems, such as the baby not getting enough oxygen. During this test, a belt is placed around your belly. The baby is made to move, and its heart rate is monitored during movement. What is false labor? False labor is a condition in which you feel small, irregular tightenings of the muscles in the womb (contractions) that usually go away with rest, changing position, or drinking water. These are called Braxton Hicks contractions. Contractions may last for hours, days, or even weeks before true labor sets in. If contractions come at regular intervals, become more frequent, increase in intensity, or become painful, you should see your health care provider. What are the signs of labor?  Abdominal cramps.  Regular contractions that start at 10 minutes apart and become stronger and more frequent with time.  Contractions that start on the top of the uterus and spread down to the lower abdomen and back.  Increased pelvic pressure and dull back pain.  A watery or bloody mucus discharge that comes from the vagina.  Leaking of amniotic fluid. This is also known as your "water breaking." It could be a slow trickle or a gush. Let your health care provider know if it has a color or strange odor. If you have any of these signs, call your health care provider right away, even if it is before your due date. Follow these instructions at home: Medicines  Follow your health care provider's instructions regarding medicine use. Specific medicines may be either safe or unsafe to take during pregnancy.  Take a prenatal vitamin that contains at least 600 micrograms (mcg) of folic acid.  If you develop constipation, try taking a stool softener if your health care provider approves. Eating and drinking   Eat a balanced diet  that includes fresh fruits and vegetables, whole grains, good sources of protein such as meat, eggs, or tofu, and low-fat dairy. Your health care provider will help you determine the amount of weight gain that is right for you.  Avoid raw meat and uncooked cheese. These carry germs that can cause birth defects in the baby.  If you have low calcium intake from food, talk to your health care provider about whether you should take a daily calcium supplement.  Eat four or five small meals rather than three large meals a day.  Limit foods that are high in fat and processed sugars, such as fried and sweet foods.  To prevent constipation: ? Drink enough fluid to keep your urine clear or pale yellow. ? Eat foods that are high in fiber, such as fresh fruits and vegetables, whole grains, and beans. Activity  Exercise only as directed by your health care provider. Most women can continue their usual exercise routine during pregnancy. Try to exercise for 30 minutes  at least 5 days a week. Stop exercising if you experience uterine contractions.  Avoid heavy lifting.  Do not exercise in extreme heat or humidity, or at high altitudes.  Wear low-heel, comfortable shoes.  Practice good posture.  You may continue to have sex unless your health care provider tells you otherwise. Relieving pain and discomfort  Take frequent breaks and rest with your legs elevated if you have leg cramps or low back pain.  Take warm sitz baths to soothe any pain or discomfort caused by hemorrhoids. Use hemorrhoid cream if your health care provider approves.  Wear a good support bra to prevent discomfort from breast tenderness.  If you develop varicose veins: ? Wear support pantyhose or compression stockings as told by your healthcare provider. ? Elevate your feet for 15 minutes, 3-4 times a day. Prenatal care  Write down your questions. Take them to your prenatal visits.  Keep all your prenatal visits as told by  your health care provider. This is important. Safety  Wear your seat belt at all times when driving.  Make a list of emergency phone numbers, including numbers for family, friends, the hospital, and police and fire departments. General instructions  Avoid cat litter boxes and soil used by cats. These carry germs that can cause birth defects in the baby. If you have a cat, ask someone to clean the litter box for you.  Do not travel far distances unless it is absolutely necessary and only with the approval of your health care provider.  Do not use hot tubs, steam rooms, or saunas.  Do not drink alcohol.  Do not use any products that contain nicotine or tobacco, such as cigarettes and e-cigarettes. If you need help quitting, ask your health care provider.  Do not use any medicinal herbs or unprescribed drugs. These chemicals affect the formation and growth of the baby.  Do not douche or use tampons or scented sanitary pads.  Do not cross your legs for long periods of time.  To prepare for the arrival of your baby: ? Take prenatal classes to understand, practice, and ask questions about labor and delivery. ? Make a trial run to the hospital. ? Visit the hospital and tour the maternity area. ? Arrange for maternity or paternity leave through employers. ? Arrange for family and friends to take care of pets while you are in the hospital. ? Purchase a rear-facing car seat and make sure you know how to install it in your car. ? Pack your hospital bag. ? Prepare the baby's nursery. Make sure to remove all pillows and stuffed animals from the baby's crib to prevent suffocation.  Visit your dentist if you have not gone during your pregnancy. Use a soft toothbrush to brush your teeth and be gentle when you floss. Contact a health care provider if:  You are unsure if you are in labor or if your water has broken.  You become dizzy.  You have mild pelvic cramps, pelvic pressure, or nagging  pain in your abdominal area.  You have lower back pain.  You have persistent nausea, vomiting, or diarrhea.  You have an unusual or bad smelling vaginal discharge.  You have pain when you urinate. Get help right away if:  Your water breaks before 37 weeks.  You have regular contractions less than 5 minutes apart before 37 weeks.  You have a fever.  You are leaking fluid from your vagina.  You have spotting or bleeding from your vagina.    You have severe abdominal pain or cramping.  You have rapid weight loss or weight gain.  You have shortness of breath with chest pain.  You notice sudden or extreme swelling of your face, hands, ankles, feet, or legs.  Your baby makes fewer than 10 movements in 2 hours.  You have severe headaches that do not go away when you take medicine.  You have vision changes. Summary  The third trimester is from week 28 through week 40, months 7 through 9. The third trimester is a time when the unborn baby (fetus) is growing rapidly.  During the third trimester, your discomfort may increase as you and your baby continue to gain weight. You may have abdominal, leg, and back pain, sleeping problems, and an increased need to urinate.  During the third trimester your breasts will keep growing and they will continue to become tender. A yellow fluid (colostrum) may leak from your breasts. This is the first milk you are producing for your baby.  False labor is a condition in which you feel small, irregular tightenings of the muscles in the womb (contractions) that eventually go away. These are called Braxton Hicks contractions. Contractions may last for hours, days, or even weeks before true labor sets in.  Signs of labor can include: abdominal cramps; regular contractions that start at 10 minutes apart and become stronger and more frequent with time; watery or bloody mucus discharge that comes from the vagina; increased pelvic pressure and dull back pain;  and leaking of amniotic fluid. This information is not intended to replace advice given to you by your health care provider. Make sure you discuss any questions you have with your health care provider. Document Revised: 11/25/2018 Document Reviewed: 09/09/2016 Elsevier Patient Education  2020 [employer].

## 2020-03-25 LAB — GC/CHLAMYDIA PROBE AMP (~~LOC~~) NOT AT ARMC
Chlamydia: NEGATIVE
Comment: NEGATIVE
Comment: NORMAL
Neisseria Gonorrhea: NEGATIVE

## 2020-03-26 ENCOUNTER — Telehealth: Payer: Self-pay | Admitting: Obstetrics and Gynecology

## 2020-03-26 DIAGNOSIS — B951 Streptococcus, group B, as the cause of diseases classified elsewhere: Secondary | ICD-10-CM | POA: Insufficient documentation

## 2020-03-26 LAB — CULTURE, BETA STREP (GROUP B ONLY): Strep Gp B Culture: POSITIVE — AB

## 2020-03-26 NOTE — Telephone Encounter (Signed)
Called pt to discuss recent lab results from clinic. Discussed plan for PCN in labor given GBS positive status. Provided reassurance that this is simply to protect baby, and does not limit her ability to breastfeed given pt's reported concerns. Also discussed negative GC/C result and provided return precautions for vaginal bleeding, signs/symptoms of labor.

## 2020-03-27 ENCOUNTER — Encounter (HOSPITAL_COMMUNITY): Payer: Self-pay | Admitting: Obstetrics and Gynecology

## 2020-03-27 ENCOUNTER — Other Ambulatory Visit: Payer: Self-pay

## 2020-03-27 ENCOUNTER — Inpatient Hospital Stay (HOSPITAL_COMMUNITY)
Admission: AD | Admit: 2020-03-27 | Discharge: 2020-03-27 | Disposition: A | Discharge status: Home / Self Care | Payer: Medicaid Other | Attending: Obstetrics and Gynecology | Admitting: Obstetrics and Gynecology

## 2020-03-27 DIAGNOSIS — Z0371 Encounter for suspected problem with amniotic cavity and membrane ruled out: Secondary | ICD-10-CM | POA: Diagnosis not present

## 2020-03-27 DIAGNOSIS — O99891 Other specified diseases and conditions complicating pregnancy: Secondary | ICD-10-CM | POA: Diagnosis not present

## 2020-03-27 DIAGNOSIS — O23593 Infection of other part of genital tract in pregnancy, third trimester: Secondary | ICD-10-CM | POA: Insufficient documentation

## 2020-03-27 DIAGNOSIS — Z3689 Encounter for other specified antenatal screening: Secondary | ICD-10-CM

## 2020-03-27 DIAGNOSIS — N760 Acute vaginitis: Secondary | ICD-10-CM

## 2020-03-27 DIAGNOSIS — B9689 Other specified bacterial agents as the cause of diseases classified elsewhere: Secondary | ICD-10-CM | POA: Insufficient documentation

## 2020-03-27 DIAGNOSIS — Z3A37 37 weeks gestation of pregnancy: Secondary | ICD-10-CM | POA: Diagnosis not present

## 2020-03-27 LAB — WET PREP, GENITAL
Sperm: NONE SEEN
Trich, Wet Prep: NONE SEEN
Yeast Wet Prep HPF POC: NONE SEEN

## 2020-03-27 MED ORDER — METRONIDAZOLE 0.75 % VA GEL: 1.0000 | Freq: Every day | VAGINAL | 0 refills | Status: DC

## 2020-03-27 NOTE — Progress Notes (Signed)
Pt came into front office to pay a bill. Pt stated to front office staff that she felt a "pop and leaking fluid" earlier today. RN spoke with pt who also reports irregular contractions today, none at this time. Denies any vaginal bleeding or other concerns. Pt instructed to go to MAU for evaluation. Report called to MAU.

## 2020-03-27 NOTE — MAU Provider Note (Signed)
First Provider Initiated Contact with Patient 03/27/20 1428       S: Ms. Rachael Gilbert is a 19 y.o. G1P0000 at [redacted]w[redacted]d  who presents to MAU today complaining of leaking of fluid since 10am. She denies vaginal bleeding. She denies contractions. She reports normal fetal movement.  Patient requests cervical check stating she would like to know if she has dilated.  O: BP 117/72 (BP Location: Right Arm)    Pulse 99    Temp 98.4 F (36.9 C) (Oral)    Resp 18    Wt 86 kg    LMP 07/14/2019    SpO2 100% Comment: room air   BMI 33.59 kg/m  GENERAL: Well-developed, well-nourished female in no acute distress.  HEAD: Normocephalic, atraumatic.  CHEST: Normal effort of breathing, regular heart rate ABDOMEN: Soft, nontender, gravid PELVIC: Normal external female genitalia. Small skin tag noted on right labia. Vagina is pink and rugated. Cervix with normal contour, no lesions. Moderate amt frothy white discharge.  Negative pooling. [facility] Collected  Cervical exam:  Dilation: Closed Effacement (%): Thick Cervical Position: Middle Station: -3 Presentation: Vertex Exam by:: Bari Mantis RN    Fetal Monitoring: FHT: 145 bpm, Mod Var, -Decels, +Accels Toco: Irritability   Results for orders placed or performed during the hospital encounter of 03/27/20 (from the past 24 hour(s))  Wet prep, genital     Status: Abnormal   Collection Time: 03/27/20  2:53 PM   Specimen: Vaginal  Result Value Ref Range   Yeast Wet Prep HPF POC NONE SEEN NONE SEEN   Trich, Wet Prep NONE SEEN NONE SEEN   Clue Cells Wet Prep HPF POC PRESENT (A) NONE SEEN   WBC, Wet Prep HPF POC MANY (A) NONE SEEN   Sperm NONE SEEN      A: SIUP at [redacted]w[redacted]d  Membranes intact  Bacterial Vaginosis NST Reactive  P: Fern Negative x 2 Results discussed with patient. Rx for metrogel sent to pharmacy on file Patient to follow up as scheduled. Labor Precautions given Discharge to home in stable condition  Gerrit Heck, [place] 03/27/2020  3:04 PM

## 2020-03-27 NOTE — MAU Note (Signed)
Felt a ? Pop earlier, followed by clear fluid.  Having some pain.

## 2020-03-27 NOTE — Discharge Instructions (Signed)

## 2020-04-02 ENCOUNTER — Other Ambulatory Visit: Payer: Self-pay

## 2020-04-02 ENCOUNTER — Ambulatory Visit (INDEPENDENT_AMBULATORY_CARE_PROVIDER_SITE_OTHER): Payer: Medicaid Other | Admitting: Nurse Practitioner

## 2020-04-02 ENCOUNTER — Encounter: Payer: Self-pay | Admitting: Family Medicine

## 2020-04-02 VITALS — BP 114/78 | HR 103 | Wt 193.0 lb

## 2020-04-02 DIAGNOSIS — Z3A38 38 weeks gestation of pregnancy: Secondary | ICD-10-CM

## 2020-04-02 DIAGNOSIS — Z3493 Encounter for supervision of normal pregnancy, unspecified, third trimester: Secondary | ICD-10-CM

## 2020-04-02 NOTE — Progress Notes (Signed)
    Subjective:  Rachael Gilbert is a 19 y.o. G1P0000 at [redacted]w[redacted]d being seen today for ongoing prenatal care.  She is currently monitored for the following issues for this low-risk pregnancy and has Bacterial vaginosis; Morning sickness; Chlamydia trachomatis infection in mother during first trimester of pregnancy; Supervision of low-risk pregnancy; and Positive GBS test on their problem list.  Patient reports occasional contractions and sometimes has dizziness when at the store.  Contractions: Irritability. Vag. Bleeding: None.  Movement: Present. Denies leaking of fluid.   The following portions of the patient's history were reviewed and updated as appropriate: allergies, current medications, past family history, past medical history, past social history, past surgical history and problem list. Problem list updated.  Objective:   Vitals:   04/02/20 1320  BP: 114/78  Pulse: (!) 103  Weight: 193 lb (87.5 kg)    Fetal Status: Fetal Heart Rate (bpm): 145 Fundal Height: 39 cm Movement: Present  Presentation: Vertex  General:  Alert, oriented and cooperative. Patient is in no acute distress.  Skin: Skin is warm and dry. No rash noted.   Cardiovascular: Normal heart rate noted  Respiratory: Normal respiratory effort, no problems with respiration noted  Abdomen: Soft, gravid, appropriate for gestational age. Pain/Pressure: Absent     Pelvic:  Cervical exam performed Dilation: Fingertip   Station: -2  Extremities: Normal range of motion.  Edema: None  Mental Status: Normal mood and affect. Normal behavior. Normal judgment and thought content.   Urinalysis:      Assessment and Plan:  Pregnancy: G1P0000 at [redacted]w[redacted]d  1. Encounter for supervision of low-risk pregnancy in third trimester Reviewed appropriate fluids and to eat before going to the store.  Reminded to wear mask when at the store as she had not had Covid vaccine. Baby moving well. Was able to attend virtual childbirth education  class  2. [redacted] weeks gestation of pregnancy   Term labor symptoms and general obstetric precautions including but not limited to vaginal bleeding, contractions, leaking of fluid and fetal movement were reviewed in detail with the patient. Please refer to After Visit Summary for other counseling recommendations.  Return in about 1 week (around 04/09/2020) for in person rob.  Nolene Bernheim, RN, MSN, NP-BC Nurse Practitioner, [employer] for [employer], [facility] 04/02/2020 1:40 PM

## 2020-04-02 NOTE — Patient Instructions (Signed)

## 2020-04-07 ENCOUNTER — Encounter: Payer: Self-pay | Admitting: Student

## 2020-04-08 ENCOUNTER — Encounter (HOSPITAL_COMMUNITY): Payer: Self-pay | Admitting: Obstetrics and Gynecology

## 2020-04-08 ENCOUNTER — Inpatient Hospital Stay (HOSPITAL_COMMUNITY)
Admission: AD | Admit: 2020-04-08 | Discharge: 2020-04-08 | Disposition: A | Discharge status: Home / Self Care | Payer: Medicaid Other | Attending: Obstetrics and Gynecology | Admitting: Obstetrics and Gynecology

## 2020-04-08 ENCOUNTER — Other Ambulatory Visit: Payer: Self-pay

## 2020-04-08 DIAGNOSIS — Z3A39 39 weeks gestation of pregnancy: Secondary | ICD-10-CM | POA: Diagnosis not present

## 2020-04-08 DIAGNOSIS — O471 False labor at or after 37 completed weeks of gestation: Secondary | ICD-10-CM | POA: Diagnosis not present

## 2020-04-08 NOTE — Discharge Instructions (Signed)

## 2020-04-08 NOTE — MAU Provider Note (Signed)
S: Ms. Rachael Gilbert is a 19 y.o. G1P0000 at [redacted]w[redacted]d  who presents to MAU today complaining contractions q 10 minutes since 0800. She denies vaginal bleeding. She denies LOF. She reports normal fetal movement.    O: BP 122/73 (BP Location: Right Arm)   Pulse 93   Temp 98.7 F (37.1 C) (Oral)   Resp 16   LMP 07/14/2019   SpO2 100% Comment: room air GENERAL: Well-developed, well-nourished female in no acute distress.  HEAD: Normocephalic, atraumatic.  CHEST: Normal effort of breathing, regular heart rate ABDOMEN: Soft, nontender, gravid  Cervical exam:  Dilation: Fingertip Effacement (%): Thick Cervical Position: Posterior Presentation: Undeterminable Exam by:: N Druebbisch RN   Fetal Monitoring: Baseline: 130 Variability: moderate Accelerations: 15x15 Decelerations: none Contractions: 2 uc's   A: SIUP at [redacted]w[redacted]d  False labor  P: -Discharge home in stable condition -Labor precautions discussed -Patient advised to follow-up with OB as scheduled for prenatal care -Patient may return to MAU as needed or if her condition were to change or worsen   Rolm Bookbinder, [place] 04/08/2020 10:14 AM

## 2020-04-08 NOTE — MAU Note (Signed)
Rachael Gilbert is a 19 y.o. at [redacted]w[redacted]d here in MAU reporting: started having contractions yesterday, states today they are more frequent and more painful. Denies bleeding or LOF. +FM  Pain score: 9/10  Vitals:   04/08/20 0944  BP: 122/73  Pulse: 93  Resp: 16  Temp: 98.7 F (37.1 C)  SpO2: 100%     FHT: +FM  Lab orders placed from triage: none

## 2020-04-09 ENCOUNTER — Other Ambulatory Visit (HOSPITAL_COMMUNITY): Payer: Self-pay | Admitting: Advanced Practice Midwife

## 2020-04-09 ENCOUNTER — Ambulatory Visit (INDEPENDENT_AMBULATORY_CARE_PROVIDER_SITE_OTHER): Payer: Medicaid Other | Admitting: Certified Nurse Midwife

## 2020-04-09 VITALS — BP 120/80 | HR 109 | Wt 197.5 lb

## 2020-04-09 DIAGNOSIS — Z3A39 39 weeks gestation of pregnancy: Secondary | ICD-10-CM

## 2020-04-09 DIAGNOSIS — Z3480 Encounter for supervision of other normal pregnancy, unspecified trimester: Secondary | ICD-10-CM

## 2020-04-09 NOTE — Patient Instructions (Addendum)
New Induction of Labor Process for [employer] [facility]  In Fall 2020 [facility] changed it's process for scheduling inductions of labor to create more induction slots and to make sure patients get COVID-19 testing in advance. After you have been tested you need to quarantine so that you do not get infected after your test. You should not go anywhere after your test except necessary medical appointments.  You have been scheduled for induction of labor on 04/20/20 at 12:00 am (midnight).  Please arrive on 04/19/20 at 11:45 pm at Entrance C, Maternity Assessment Unit (MAU) security desk unless you are called to be placed on hold. You may eat a light meal before coming to the hospital.  If you go into labor, think your water has broken, experience bright red bleeding or don't feel your baby moving as much as usual before your induction, please call your Ob/Gyn's office or come to Entrance C, Maternity Assessment Unit for evaluation.  Thank you,  Center for [employer]

## 2020-04-09 NOTE — Progress Notes (Signed)
   PRENATAL VISIT NOTE  Subjective:  Rachael Gilbert is a 19 y.o. G1P0000 at [redacted]w[redacted]d being seen today for ongoing prenatal care.  She is currently monitored for the following issues for this low-risk pregnancy and has Bacterial vaginosis; Morning sickness; Chlamydia trachomatis infection in mother during first trimester of pregnancy; Supervision of low-risk pregnancy; and Positive GBS test on their problem list.  Patient reports continued contractions every , but not increasing in intensity and no bloody show. Presented to MAU yesterday re: q108min contractions but was not in labor.  Contractions: Irregular. Vag. Bleeding: None.  Movement: Present. Denies leaking of fluid.   The following portions of the patient's history were reviewed and updated as appropriate: allergies, current medications, past family history, past medical history, past social history, past surgical history and problem list.   Objective:   Vitals:   04/09/20 1307  BP: 120/80  Pulse: (!) 109  Weight: 197 lb 8 oz (89.6 kg)    Fetal Status: Fetal Heart Rate (bpm): 143 Fundal Height: 40 cm Movement: Present  Presentation: Vertex  General:  Alert, oriented and cooperative. Patient is in no acute distress.  Skin: Skin is warm and dry. No rash noted.   Cardiovascular: Normal heart rate noted  Respiratory: Normal respiratory effort, no problems with respiration noted  Abdomen: Soft, gravid, appropriate for gestational age.  Pain/Pressure: Present     Pelvic: Cervical exam performed in the presence of a chaperone Dilation: Fingertip Effacement (%): 50 Station: -1  Extremities: Normal range of motion.  Edema: None  Mental Status: Normal mood and affect. Normal behavior. Normal judgment and thought content.   Assessment and Plan:  Pregnancy: G1P0000 at [redacted]w[redacted]d 1. Supervision of other normal pregnancy, antepartum  2. [redacted] weeks gestation of pregnancy - Encouraged rest and hydration, walking outside only if it is cooler out  and she is well hydrated - IOL scheduled for 04/20/20 at midnight  Term labor symptoms and general obstetric precautions including but not limited to vaginal bleeding, contractions, leaking of fluid and fetal movement were reviewed in detail with the patient. Please refer to After Visit Summary for other counseling recommendations.   Return in about 1 week (around 04/16/2020) for IN-PERSON, LOB.  Future Appointments  Date Time Provider Department Center  04/16/2020  1:15 PM Barbaraann Boys [facility]  04/20/2020 12:00 AM [facility] SCHED ROOM [facility] None   Edd Arbour, CNM, MSN, [facility] 04/09/20 1:57 PM

## 2020-04-10 ENCOUNTER — Other Ambulatory Visit: Payer: Self-pay

## 2020-04-10 ENCOUNTER — Encounter (HOSPITAL_COMMUNITY): Payer: Self-pay | Admitting: Obstetrics and Gynecology

## 2020-04-10 ENCOUNTER — Inpatient Hospital Stay (EMERGENCY_DEPARTMENT_HOSPITAL)
Admission: AD | Admit: 2020-04-10 | Discharge: 2020-04-11 | Disposition: A | Discharge status: Home / Self Care | Payer: Medicaid Other | Source: Home / Self Care | Attending: Family Medicine | Admitting: Family Medicine

## 2020-04-10 ENCOUNTER — Encounter (HOSPITAL_COMMUNITY): Payer: Self-pay | Admitting: Family Medicine

## 2020-04-10 ENCOUNTER — Inpatient Hospital Stay (EMERGENCY_DEPARTMENT_HOSPITAL)
Admission: AD | Admit: 2020-04-10 | Discharge: 2020-04-10 | Disposition: A | Discharge status: Home / Self Care | Payer: Medicaid Other | Source: Home / Self Care | Attending: Obstetrics and Gynecology | Admitting: Obstetrics and Gynecology

## 2020-04-10 ENCOUNTER — Other Ambulatory Visit: Payer: Self-pay | Admitting: Advanced Practice Midwife

## 2020-04-10 DIAGNOSIS — O471 False labor at or after 37 completed weeks of gestation: Secondary | ICD-10-CM

## 2020-04-10 DIAGNOSIS — O26893 Other specified pregnancy related conditions, third trimester: Secondary | ICD-10-CM

## 2020-04-10 DIAGNOSIS — O4693 Antepartum hemorrhage, unspecified, third trimester: Secondary | ICD-10-CM | POA: Insufficient documentation

## 2020-04-10 DIAGNOSIS — O163 Unspecified maternal hypertension, third trimester: Secondary | ICD-10-CM | POA: Diagnosis not present

## 2020-04-10 DIAGNOSIS — I10 Essential (primary) hypertension: Secondary | ICD-10-CM

## 2020-04-10 DIAGNOSIS — N898 Other specified noninflammatory disorders of vagina: Secondary | ICD-10-CM | POA: Insufficient documentation

## 2020-04-10 DIAGNOSIS — Z3A39 39 weeks gestation of pregnancy: Secondary | ICD-10-CM | POA: Insufficient documentation

## 2020-04-10 DIAGNOSIS — Z87440 Personal history of urinary (tract) infections: Secondary | ICD-10-CM | POA: Insufficient documentation

## 2020-04-10 DIAGNOSIS — J45909 Unspecified asthma, uncomplicated: Secondary | ICD-10-CM | POA: Insufficient documentation

## 2020-04-10 DIAGNOSIS — O479 False labor, unspecified: Secondary | ICD-10-CM

## 2020-04-10 DIAGNOSIS — Z8249 Family history of ischemic heart disease and other diseases of the circulatory system: Secondary | ICD-10-CM | POA: Insufficient documentation

## 2020-04-10 DIAGNOSIS — Z79899 Other long term (current) drug therapy: Secondary | ICD-10-CM | POA: Insufficient documentation

## 2020-04-10 DIAGNOSIS — O10913 Unspecified pre-existing hypertension complicating pregnancy, third trimester: Secondary | ICD-10-CM | POA: Insufficient documentation

## 2020-04-10 DIAGNOSIS — O99513 Diseases of the respiratory system complicating pregnancy, third trimester: Secondary | ICD-10-CM | POA: Insufficient documentation

## 2020-04-10 LAB — COMPREHENSIVE METABOLIC PANEL
ALT: 17 U/L (ref 0–44)
AST: 28 U/L (ref 15–41)
Albumin: 2.6 g/dL — ABNORMAL LOW (ref 3.5–5.0)
Alkaline Phosphatase: 175 U/L — ABNORMAL HIGH (ref 38–126)
Anion gap: 11 (ref 5–15)
BUN: 6 mg/dL (ref 6–20)
CO2: 19 mmol/L — ABNORMAL LOW (ref 22–32)
Calcium: 9 mg/dL (ref 8.9–10.3)
Chloride: 102 mmol/L (ref 98–111)
Creatinine, Ser: 0.64 mg/dL (ref 0.44–1.00)
GFR calc Af Amer: >60 mL/min (ref >60)
GFR calc non Af Amer: >60 mL/min (ref >60)
Glucose, Bld: 89 mg/dL (ref 70–99)
Potassium: 4 mmol/L (ref 3.5–5.1)
Sodium: 132 mmol/L — ABNORMAL LOW (ref 135–145)
Total Bilirubin: 0.6 mg/dL (ref 0.3–1.2)
Total Protein: 6.5 g/dL (ref 6.5–8.1)

## 2020-04-10 LAB — POCT FERN TEST
POCT Fern Test: NEGATIVE
POCT Fern Test: NEGATIVE

## 2020-04-10 LAB — CBC
HCT: 33.2 % — ABNORMAL LOW (ref 36.0–46.0)
Hemoglobin: 10.8 g/dL — ABNORMAL LOW (ref 12.0–15.0)
MCH: 28.1 pg (ref 26.0–34.0)
MCHC: 32.5 g/dL (ref 30.0–36.0)
MCV: 86.5 fL (ref 80.0–100.0)
Platelets: 237 10*3/uL (ref 150–400)
RBC: 3.84 MIL/uL — ABNORMAL LOW (ref 3.87–5.11)
RDW: 14.5 % (ref 11.5–15.5)
WBC: 13.7 10*3/uL — ABNORMAL HIGH (ref 4.0–10.5)
nRBC: 0 % (ref 0.0–0.2)

## 2020-04-10 LAB — PROTEIN / CREATININE RATIO, URINE
Creatinine, Urine: 173.79 mg/dL
Protein Creatinine Ratio: 0.17 mg/mg{Cre} — ABNORMAL HIGH (ref 0.00–0.15)
Total Protein, Urine: 30 mg/dL

## 2020-04-10 MED ORDER — PROMETHAZINE HCL 25 MG/ML IJ SOLN
12.5000 mg | Freq: Once | INTRAMUSCULAR | Status: AC
  Administered 2020-04-10: 12.5 mg via INTRAMUSCULAR
  Filled 2020-04-10: qty 1

## 2020-04-10 MED ORDER — FAMOTIDINE 20 MG PO TABS: 20.0000 mg | ORAL_TABLET | Freq: Two times a day (BID) | ORAL | 0 refills | Status: DC

## 2020-04-10 MED ORDER — NALBUPHINE HCL 10 MG/ML IJ SOLN
10.0000 mg | Freq: Once | INTRAMUSCULAR | Status: DC
  Filled 2020-04-10: qty 1

## 2020-04-10 MED ORDER — NALBUPHINE HCL 10 MG/ML IJ SOLN
10.0000 mg | Freq: Once | INTRAMUSCULAR | Status: AC
  Administered 2020-04-10: 10 mg via INTRAMUSCULAR

## 2020-04-10 MED ORDER — OXYCODONE-ACETAMINOPHEN 5-325 MG PO TABS
2.0000 | ORAL_TABLET | Freq: Once | ORAL | Status: AC
  Administered 2020-04-10: 2 via ORAL
  Filled 2020-04-10: qty 2

## 2020-04-10 NOTE — MAU Note (Signed)
Rachael Gilbert is a 19 y.o. at [redacted]w[redacted]d here in MAU reporting: contractions since last night, they are now every 2-3 minutes. Saw a little bit of fluid last night, clear and watery. Unsure if water broke. Saw a little bit of bleeding this AM.  Onset of complaint: last night  Pain score: 10/10  Vitals:   04/10/20 0933  BP: 137/66  Pulse: (!) 108  Resp: 16  Temp: 98.3 F (36.8 C)  SpO2: 98%     FHT: +FM  Lab orders placed from triage: none

## 2020-04-10 NOTE — Telephone Encounter (Signed)
Fax request received from [facility] for Famotidine. Prescription reordered per protocol.   Bryson Dames., RN

## 2020-04-10 NOTE — Discharge Instructions (Signed)

## 2020-04-10 NOTE — MAU Provider Note (Addendum)
Chief Complaint:  Contractions   First Provider Initiated Contact with Patient 04/10/20 2048     HPI: Rachael Gilbert is a 19 y.o. G1P0000 at 45w4dwho presents to maternity admissions reporting painful uterine contractions.  While being evaluated for labor, BP was noted to be elevated.  This is a new finding.  Denies other symptoms.  . She reports good fetal movement, denies LOF, vaginal itching/burning, urinary symptoms, h/a, dizziness, n/v, diarrhea, constipation or fever/chills.  She denies headache, visual changes or RUQ abdominal pain.  Hypertension This is a new problem. The current episode started today. The problem is unchanged. Pertinent negatives include no anxiety, blurred vision, chest pain, headaches, malaise/fatigue, peripheral edema or shortness of breath. There are no associated agents to hypertension. There are no known risk factors for coronary artery disease. Past treatments include nothing. There are no compliance problems.    RN Note: Pt here with reports of contractions that started last night, now every 2 mins. She denies LOF. Has had some bloody show. Was 1-2cm this morning. Good fetal movement.  Past Medical History: Past Medical History:  Diagnosis Date  . Asthma   . Chlamydia infection   . UTI (urinary tract infection)   . Vitiligo     Past obstetric history: OB History  Gravida Para Term Preterm AB Living  1 0 0 0 0 0  SAB TAB Ectopic Multiple Live Births  0 0 0 0      # Outcome Date GA Lbr Len/2nd Weight Sex Delivery Anes PTL Lv  1 Current             Past Surgical History: Past Surgical History:  Procedure Laterality Date  . NO PAST SURGERIES    . WISDOM TOOTH EXTRACTION      Family History: Family History  Problem Relation Age of Onset  . Bipolar disorder Mother   . Heart disease Mother   . Hypertension Mother   . Bipolar disorder Father     Social History: Social History   Tobacco Use  . Smoking status: Never Smoker  . Smokeless  tobacco: Never Used  Vaping Use  . Vaping Use: Never used  Substance Use Topics  . Alcohol use: Not Currently  . Drug use: Not Currently    Types: Marijuana    Comment: last used in 2020    Allergies: No Known Allergies  Meds:  Medications Prior to Admission  Medication Sig Dispense Refill Last Dose  . famotidine (PEPCID) 20 MG tablet Take 1 tablet (20 mg total) by mouth 2 (two) times daily. 60 tablet 0 04/09/2020 at Unknown time  . prenatal vitamin w/FE, FA (NATACHEW) 29-1 MG CHEW chewable tablet Chew 1 tablet by mouth daily at 12 noon. 30 tablet 11 04/09/2020 at Unknown time  . Blood Pressure Monitoring (BLOOD PRESSURE KIT) DEVI 1 Device by Does not apply route as needed. 1 each 0   . Elastic Bandages & Supports (COMFORT FIT MATERNITY SUPP LG) MISC 1 Units by Does not apply route daily as needed. 1 each 0   . fexofenadine (ALLEGRA) 180 MG tablet Take 180 mg by mouth daily as needed for allergies.      Marland Kitchen lidocaine (XYLOCAINE) 5 % ointment Apply 1 application topically as needed. 35.44 g 2     I have reviewed patient's Past Medical Hx, Surgical Hx, Family Hx, Social Hx, medications and allergies.   ROS:  Review of Systems  Constitutional: Negative for malaise/fatigue.  Eyes: Negative for blurred vision.  Respiratory:  Negative for shortness of breath.   Cardiovascular: Negative for chest pain.  Neurological: Negative for headaches.   Other systems negative  Physical Exam   Patient Vitals for the past 24 hrs:  BP Temp Temp src Pulse Resp SpO2  04/10/20 2036 132/78 -- -- (!) 108 -- --  04/10/20 2028 140/83 99.2 F (37.3 C) Oral (!) 110 20 99 %   Vitals:   04/10/20 2100 04/10/20 2130 04/10/20 2231 04/11/20 0002  BP: 139/85 140/81 125/81 134/81  Pulse: (!) 108 (!) 102 (!) 110 (!) 108  Resp:      Temp:      TempSrc:      SpO2: 98% 98%      Constitutional: Well-developed, well-nourished female in no acute distress.  Cardiovascular: normal rate and rhythm Respiratory:  normal effort, clear to auscultation bilaterally GI: Abd soft, non-tender, gravid appropriate for gestational age.   No rebound or guarding. MS: Extremities nontender, no edema, normal ROM Neurologic: Alert and oriented x 4.   DTRs 2+, no clonus GU: Neg CVAT.  PELVIC EXAM:  Dilation: 2 Effacement (%): 70 Cervical Position: Posterior Station: -2 Presentation: Vertex Exam by:: lynnette weston,rn  FHT:  Baseline 140 , moderate variability, accelerations present, no decelerations Contractions: q 2-4 mins Irregular    Labs: B/Positive/-- (02/22 1039) Results for orders placed or performed during the hospital encounter of 04/10/20 (from the past 24 hour(s))  CBC     Status: Abnormal   Collection Time: 04/10/20  9:11 PM  Result Value Ref Range   WBC 13.7 (H) 4.0 - 10.5 K/uL   RBC 3.84 (L) 3.87 - 5.11 MIL/uL   Hemoglobin 10.8 (L) 12.0 - 15.0 g/dL   HCT 33.2 (L) 36 - 46 %   MCV 86.5 80.0 - 100.0 fL   MCH 28.1 26.0 - 34.0 pg   MCHC 32.5 30.0 - 36.0 g/dL   RDW 14.5 11.5 - 15.5 %   Platelets 237 150 - 400 K/uL   nRBC 0.0 0.0 - 0.2 %  Comprehensive metabolic panel     Status: Abnormal   Collection Time: 04/10/20  9:11 PM  Result Value Ref Range   Sodium 132 (L) 135 - 145 mmol/L   Potassium 4.0 3.5 - 5.1 mmol/L   Chloride 102 98 - 111 mmol/L   CO2 19 (L) 22 - 32 mmol/L   Glucose, Bld 89 70 - 99 mg/dL   BUN 6 6 - 20 mg/dL   Creatinine, Ser 0.64 0.44 - 1.00 mg/dL   Calcium 9.0 8.9 - 10.3 mg/dL   Total Protein 6.5 6.5 - 8.1 g/dL   Albumin 2.6 (L) 3.5 - 5.0 g/dL   AST 28 15 - 41 U/L   ALT 17 0 - 44 U/L   Alkaline Phosphatase 175 (H) 38 - 126 U/L   Total Bilirubin 0.6 0.3 - 1.2 mg/dL   GFR calc non Af Amer >60 >60 mL/min   GFR calc Af Amer >60 >60 mL/min   Anion gap 11 5 - 15  Protein / creatinine ratio, urine     Status: Abnormal   Collection Time: 04/10/20 10:10 PM  Result Value Ref Range   Creatinine, Urine 173.79 mg/dL   Total Protein, Urine 30 mg/dL   Protein Creatinine  Ratio 0.17 (H) 0.00 - 0.15 mg/mg[Cre]    Imaging:  No results found.  MAU Course/MDM: I have ordered labs and reviewed results. No abnormalities noted NST reviewed and is reassuring, category I No further elevations noted in  BP.  Only one single systolic of 794.   Treatments in MAU included EFM.  Percocet given for pain/therapeutic rest.  Assessment: Single IUP at [redacted]w[redacted]d New intermittent hypertension, r/o Gestational hypertension (has not had 2 values over 4 hours)  Plan: Discharge home Labor precautions  Return to MAU when contractions increase in frequency and intensity or if ROM or bleeding Encouraged to return here or to other Urgent Care/ED if she develops worsening of symptoms, increase in pain, fever, or other concerning symptoms.      Hansel Feinstein CNM, MSN Certified Nurse-Midwife 04/10/2020 8:48 PM

## 2020-04-10 NOTE — MAU Note (Signed)
Pt here with reports of contractions that started last night, now every 2 mins. She denies LOF. Has had some bloody show. Was 1-2cm this morning. Good fetal movement.

## 2020-04-10 NOTE — MAU Provider Note (Signed)
History     CSN: [id number]  Arrival date and time: 04/10/20 0298   First Provider Initiated Contact with Patient 04/10/20 1018      Chief Complaint  Patient presents with  . Contractions  . Rupture of Membranes   Rachael Gilbert is a 19 y.o. G1P0000 at [redacted]w[redacted]d who presents today with leaking fluid and contractions. She states that contractions and leaking started last night. She has also had some bloody mucous. She reports normal fetal movement. She denies any complications with this pregnancy.   Pelvic Pain The patient's primary symptoms include pelvic pain and vaginal discharge. This is a new problem. The current episode started yesterday. The problem occurs intermittently. The problem has been gradually worsening. The problem affects both sides. Pertinent negatives include no chills, dysuria, fever, frequency, nausea or vomiting. The vaginal discharge was mucoid. The vaginal bleeding is spotting. Nothing aggravates the symptoms. She has tried nothing for the symptoms.    OB History    Gravida  1   Para  0   Term  0   Preterm  0   AB  0   Living  0     SAB  0   TAB  0   Ectopic  0   Multiple  0   Live Births              Past Medical History:  Diagnosis Date  . Asthma   . Chlamydia infection   . UTI (urinary tract infection)   . Vitiligo     Past Surgical History:  Procedure Laterality Date  . NO PAST SURGERIES    . WISDOM TOOTH EXTRACTION      Family History  Problem Relation Age of Onset  . Bipolar disorder Mother   . Heart disease Mother   . Hypertension Mother   . Bipolar disorder Father     Social History   Tobacco Use  . Smoking status: Never Smoker  . Smokeless tobacco: Never Used  Vaping Use  . Vaping Use: Never used  Substance Use Topics  . Alcohol use: Not Currently  . Drug use: Not Currently    Types: Marijuana    Comment: last used in 2020    Allergies: No Known Allergies  Medications Prior to Admission  Medication  Sig Dispense Refill Last Dose  . famotidine (PEPCID) 20 MG tablet Take 1 tablet (20 mg total) by mouth 2 (two) times daily. 60 tablet 0 04/09/2020 at Unknown time  . fexofenadine (ALLEGRA) 180 MG tablet Take 180 mg by mouth daily as needed for allergies.    Past Week at Unknown time  . prenatal vitamin w/FE, FA (NATACHEW) 29-1 MG CHEW chewable tablet Chew 1 tablet by mouth daily at 12 noon. 30 tablet 11 04/09/2020 at Unknown time  . Blood Pressure Monitoring (BLOOD PRESSURE KIT) DEVI 1 Device by Does not apply route as needed. 1 each 0   . Elastic Bandages & Supports (COMFORT FIT MATERNITY SUPP LG) MISC 1 Units by Does not apply route daily as needed. 1 each 0   . lidocaine (XYLOCAINE) 5 % ointment Apply 1 application topically as needed. 35.44 g 2     Review of Systems  Constitutional: Negative for chills and fever.  Gastrointestinal: Negative for nausea and vomiting.  Genitourinary: Positive for pelvic pain, vaginal bleeding and vaginal discharge. Negative for dysuria and frequency.   Physical Exam   Blood pressure 137/66, pulse (!) 108, temperature 98.3 F (36.8 C), temperature source Oral,  resp. rate 16, last menstrual period 07/14/2019, SpO2 98 %, unknown if currently breastfeeding.  Physical Exam Vitals and nursing note reviewed. Exam conducted with a chaperone present.  HENT:     Head: Normocephalic.  Cardiovascular:     Rate and Rhythm: Normal rate.  Pulmonary:     Effort: Pulmonary effort is normal.  Abdominal:     Palpations: Abdomen is soft.     Tenderness: There is no abdominal tenderness.  Genitourinary:    Comments:  External: no lesion Vagina: small amount of bloody mucous  Cervix: pink, smooth, 1-2/70 Uterus: AGA Skin:    General: Skin is warm and dry.  Neurological:     Mental Status: She is alert and oriented to person, place, and time.  Psychiatric:        Mood and Affect: Mood normal.        Behavior: Behavior normal.    NST:  Baseline:  140 Variability: moderate Accels: 15x15 Decels: none Toco: q 3-6 mins Reactive/Appropriate for GA   Results for orders placed or performed during the hospital encounter of 04/10/20 (from the past 24 hour(s))  POCT fern test     Status: None   Collection Time: 04/10/20 11:16 AM  Result Value Ref Range   POCT Fern Test Negative = intact amniotic membranes   Fern Test     Status: Normal   Collection Time: 04/10/20 12:13 PM  Result Value Ref Range   POCT Fern Test Negative = intact amniotic membranes     MAU Course  Procedures  MDM Patient with no change over > 1 hour. Offered therapeutic rest with nubain and phenergan. She would like to try this. Will give meds and DC home.   Assessment and Plan   1. False labor after 37 completed weeks of gestation   2. [redacted] weeks gestation of pregnancy    DC home Comfort measures reviewed  3rd Trimester precautions  Labor precautions  Fetal kick counts RX: none  Return to MAU as needed FU with OB as planned   [place] [facility] Follow up.   Specialty: Obstetrics and Gynecology Contact information: [place] [id number] [place] DNP, CNM  04/10/20  12:16 PM

## 2020-04-11 ENCOUNTER — Inpatient Hospital Stay (HOSPITAL_COMMUNITY)
Admission: AD | Admit: 2020-04-11 | Discharge: 2020-04-14 | DRG: 805 | Disposition: A | Discharge status: Home / Self Care | Payer: Medicaid Other | Attending: Obstetrics and Gynecology | Admitting: Obstetrics and Gynecology

## 2020-04-11 ENCOUNTER — Inpatient Hospital Stay (HOSPITAL_COMMUNITY): Payer: Medicaid Other | Admitting: Anesthesiology

## 2020-04-11 ENCOUNTER — Encounter (HOSPITAL_COMMUNITY): Payer: Self-pay | Admitting: Family Medicine

## 2020-04-11 DIAGNOSIS — O98311 Other infections with a predominantly sexual mode of transmission complicating pregnancy, first trimester: Secondary | ICD-10-CM | POA: Diagnosis present

## 2020-04-11 DIAGNOSIS — Z3A39 39 weeks gestation of pregnancy: Secondary | ICD-10-CM | POA: Diagnosis not present

## 2020-04-11 DIAGNOSIS — K219 Gastro-esophageal reflux disease without esophagitis: Secondary | ICD-10-CM | POA: Diagnosis present

## 2020-04-11 DIAGNOSIS — O9962 Diseases of the digestive system complicating childbirth: Secondary | ICD-10-CM | POA: Diagnosis present

## 2020-04-11 DIAGNOSIS — Z30017 Encounter for initial prescription of implantable subdermal contraceptive: Secondary | ICD-10-CM

## 2020-04-11 DIAGNOSIS — O411230 Chorioamnionitis, third trimester, not applicable or unspecified: Secondary | ICD-10-CM | POA: Diagnosis present

## 2020-04-11 DIAGNOSIS — Z20822 Contact with and (suspected) exposure to covid-19: Secondary | ICD-10-CM | POA: Diagnosis present

## 2020-04-11 DIAGNOSIS — Z975 Presence of (intrauterine) contraceptive device: Secondary | ICD-10-CM

## 2020-04-11 DIAGNOSIS — B9689 Other specified bacterial agents as the cause of diseases classified elsewhere: Secondary | ICD-10-CM | POA: Diagnosis present

## 2020-04-11 DIAGNOSIS — O26893 Other specified pregnancy related conditions, third trimester: Secondary | ICD-10-CM | POA: Diagnosis present

## 2020-04-11 DIAGNOSIS — Z3490 Encounter for supervision of normal pregnancy, unspecified, unspecified trimester: Secondary | ICD-10-CM | POA: Diagnosis present

## 2020-04-11 DIAGNOSIS — O99824 Streptococcus B carrier state complicating childbirth: Principal | ICD-10-CM | POA: Diagnosis present

## 2020-04-11 DIAGNOSIS — B951 Streptococcus, group B, as the cause of diseases classified elsewhere: Secondary | ICD-10-CM | POA: Diagnosis present

## 2020-04-11 DIAGNOSIS — O411290 Chorioamnionitis, unspecified trimester, not applicable or unspecified: Secondary | ICD-10-CM | POA: Diagnosis not present

## 2020-04-11 DIAGNOSIS — A568 Sexually transmitted chlamydial infection of other sites: Secondary | ICD-10-CM | POA: Diagnosis present

## 2020-04-11 LAB — RPR: RPR Ser Ql: NONREACTIVE

## 2020-04-11 LAB — SARS CORONAVIRUS 2 BY RT PCR (HOSPITAL ORDER, PERFORMED IN ~~LOC~~ HOSPITAL LAB): SARS Coronavirus 2: NEGATIVE

## 2020-04-11 LAB — CBC
HCT: 35.9 % — ABNORMAL LOW (ref 36.0–46.0)
Hemoglobin: 11.7 g/dL — ABNORMAL LOW (ref 12.0–15.0)
MCH: 28.9 pg (ref 26.0–34.0)
MCHC: 32.6 g/dL (ref 30.0–36.0)
MCV: 88.6 fL (ref 80.0–100.0)
Platelets: 265 10*3/uL (ref 150–400)
RBC: 4.05 MIL/uL (ref 3.87–5.11)
RDW: 14.6 % (ref 11.5–15.5)
WBC: 17.5 10*3/uL — ABNORMAL HIGH (ref 4.0–10.5)
nRBC: 0 % (ref 0.0–0.2)

## 2020-04-11 LAB — TYPE AND SCREEN
ABO/RH(D): B POS
Antibody Screen: NEGATIVE

## 2020-04-11 MED ORDER — GENTAMICIN SULFATE 40 MG/ML IJ SOLN
5.0000 mg/kg | INTRAVENOUS | Status: DC
  Administered 2020-04-11: 340 mg via INTRAVENOUS
  Filled 2020-04-11: qty 8.5

## 2020-04-11 MED ORDER — SODIUM CHLORIDE 0.9 % IV SOLN
2.0000 g | Freq: Four times a day (QID) | INTRAVENOUS | Status: DC
  Administered 2020-04-11 (×3): 2 g via INTRAVENOUS
  Filled 2020-04-11 (×3): qty 2000

## 2020-04-11 MED ORDER — OXYCODONE-ACETAMINOPHEN 5-325 MG PO TABS: 1.0000 | ORAL_TABLET | ORAL | Status: DC | PRN

## 2020-04-11 MED ORDER — LACTATED RINGERS IV SOLN: INTRAVENOUS | Status: DC

## 2020-04-11 MED ORDER — ACETAMINOPHEN 325 MG PO TABS
650.0000 mg | ORAL_TABLET | ORAL | Status: DC | PRN
  Administered 2020-04-11: 650 mg via ORAL
  Filled 2020-04-11: qty 2

## 2020-04-11 MED ORDER — SODIUM CHLORIDE (PF) 0.9 % IJ SOLN
INTRAMUSCULAR | Status: DC | PRN
Start: 2020-04-11 — End: 2020-04-11
  Administered 2020-04-11: 12 mL/h via EPIDURAL

## 2020-04-11 MED ORDER — TERBUTALINE SULFATE 1 MG/ML IJ SOLN: 0.2500 mg | Freq: Once | INTRAMUSCULAR | Status: DC | PRN

## 2020-04-11 MED ORDER — PANTOPRAZOLE SODIUM 40 MG PO TBEC
40.0000 mg | DELAYED_RELEASE_TABLET | Freq: Every day | ORAL | Status: DC
  Administered 2020-04-11: 40 mg via ORAL
  Filled 2020-04-11: qty 1

## 2020-04-11 MED ORDER — PHENYLEPHRINE 40 MCG/ML (10ML) SYRINGE FOR IV PUSH (FOR BLOOD PRESSURE SUPPORT): 80.0000 ug | PREFILLED_SYRINGE | INTRAVENOUS | Status: DC | PRN

## 2020-04-11 MED ORDER — EPHEDRINE 5 MG/ML INJ: 10.0000 mg | INTRAVENOUS | Status: DC | PRN

## 2020-04-11 MED ORDER — ONDANSETRON HCL 4 MG/2ML IJ SOLN: 4.0000 mg | Freq: Four times a day (QID) | INTRAMUSCULAR | Status: DC | PRN

## 2020-04-11 MED ORDER — OXYTOCIN BOLUS FROM INFUSION
333.0000 mL | Freq: Once | INTRAVENOUS | Status: AC
  Administered 2020-04-11: 333 mL via INTRAVENOUS

## 2020-04-11 MED ORDER — LIDOCAINE HCL (PF) 1 % IJ SOLN: 30.0000 mL | INTRAMUSCULAR | Status: DC | PRN

## 2020-04-11 MED ORDER — LIDOCAINE HCL (PF) 1 % IJ SOLN
INTRAMUSCULAR | Status: DC | PRN
  Administered 2020-04-11: 10 mL via EPIDURAL

## 2020-04-11 MED ORDER — FENTANYL-BUPIVACAINE-NACL 0.5-0.125-0.9 MG/250ML-% EP SOLN
12.0000 mL/h | EPIDURAL | Status: DC | PRN
  Filled 2020-04-11: qty 250

## 2020-04-11 MED ORDER — OXYCODONE-ACETAMINOPHEN 5-325 MG PO TABS: 2.0000 | ORAL_TABLET | ORAL | Status: DC | PRN

## 2020-04-11 MED ORDER — OXYTOCIN-SODIUM CHLORIDE 30-0.9 UT/500ML-% IV SOLN
1.0000 m[IU]/min | INTRAVENOUS | Status: DC
  Administered 2020-04-11: 2 m[IU]/min via INTRAVENOUS

## 2020-04-11 MED ORDER — LACTATED RINGERS IV SOLN: 500.0000 mL | Freq: Once | INTRAVENOUS | Status: DC

## 2020-04-11 MED ORDER — LACTATED RINGERS IV SOLN: 500.0000 mL | INTRAVENOUS | Status: DC | PRN

## 2020-04-11 MED ORDER — SOD CITRATE-CITRIC ACID 500-334 MG/5ML PO SOLN
30.0000 mL | ORAL | Status: DC | PRN
  Administered 2020-04-11: 30 mL via ORAL
  Filled 2020-04-11: qty 30

## 2020-04-11 MED ORDER — DIPHENHYDRAMINE HCL 50 MG/ML IJ SOLN: 12.5000 mg | INTRAMUSCULAR | Status: DC | PRN

## 2020-04-11 MED ORDER — OXYTOCIN-SODIUM CHLORIDE 30-0.9 UT/500ML-% IV SOLN
2.5000 [IU]/h | INTRAVENOUS | Status: DC
  Filled 2020-04-11: qty 500

## 2020-04-11 NOTE — Anesthesia Preprocedure Evaluation (Signed)
Anesthesia Evaluation  Patient identified by MRN, date of birth, ID band  Reviewed: Allergy & Precautions, NPO status , Patient's Chart, lab work & pertinent test results  Airway Mallampati: II  TM Distance: >3 FB Neck ROM: Full    Dental no notable dental hx.    Pulmonary asthma ,    Pulmonary exam normal breath sounds clear to auscultation       Cardiovascular Exercise Tolerance: Good negative cardio ROS Normal cardiovascular exam Rhythm:Regular Rate:Normal     Neuro/Psych negative neurological ROS  negative psych ROS   GI/Hepatic negative GI ROS, Neg liver ROS,   Endo/Other  negative endocrine ROS  Renal/GU negative Renal ROS  negative genitourinary   Musculoskeletal negative musculoskeletal ROS (+)   Abdominal   Peds negative pediatric ROS (+)  Hematology  (+) anemia ,   Anesthesia Other Findings   Reproductive/Obstetrics (+) Pregnancy                             Anesthesia Physical Anesthesia Plan  ASA: II  Anesthesia Plan: Epidural   Post-op Pain Management:    Induction:   PONV Risk Score and Plan:   Airway Management Planned:   Additional Equipment:   Intra-op Plan:   Post-operative Plan:   Informed Consent: I have reviewed the patients History and Physical, chart, labs and discussed the procedure including the risks, benefits and alternatives for the proposed anesthesia with the patient or authorized representative who has indicated his/her understanding and acceptance.       Plan Discussed with:   Anesthesia Plan Comments:         Anesthesia Quick Evaluation

## 2020-04-11 NOTE — Progress Notes (Signed)
Pharmacy Antibiotic Note  Rachael Gilbert is a 19 y.o. female admitted on 04/11/2020 with SOL at [redacted]w[redacted]d.  Pharmacy has been consulted for Gentamicin dosing for maternal temp during labor to r/o Triple I (chorioamnionitis).  Plan: Gentamicin 340mg  (5mg /kg) IV q24h Will continue to follow and assess need for further workup  Height: 5\' 3"  (160 cm) Weight: 89.4 kg (197 lb) IBW/kg (Calculated) : 52.4  Adjusted/Dosing weight: 67.2kg  Temp (24hrs), Avg:99.1 F (37.3 C), Min:97.5 F (36.4 C), Max:100.4 F (38 C)  Recent Labs  Lab 04/10/20 2111 04/11/20 0654  WBC 13.7* 17.5*  CREATININE 0.64  --     Estimated Creatinine Clearance: 119 mL/min (by C-G formula based on SCr of 0.64 mg/dL).    No Known Allergies  Antimicrobials this admission: Ampicillin 2 gram IV q6h  8/25 >>    Thank you for allowing pharmacy to be a part of this patient's care.  2112 04/11/2020 6:54 PM

## 2020-04-11 NOTE — Anesthesia Procedure Notes (Signed)
Epidural Patient location during procedure: floor Start time: 04/11/2020 8:01 AM End time: 04/11/2020 8:07 AM  Staffing Anesthesiologist: Mellody Dance, MD  Preanesthetic Checklist Completed: patient identified, IV checked, site marked, risks and benefits discussed, surgical consent, monitors and equipment checked, pre-op evaluation and timeout performed  Epidural Patient position: sitting Prep: ChloraPrep Patient monitoring: heart rate, continuous pulse ox and blood pressure Approach: midline Location: L3-L4 Injection technique: LOR saline and LOR air  Needle:  Needle gauge: 17 G Needle insertion depth: 6 cm Catheter type: closed end flexible Catheter size: 20 Guage Catheter at skin depth: 11 cm Test dose: negative

## 2020-04-11 NOTE — Progress Notes (Signed)
Patient ID: KALYSTA KNEISLEY, female   DOB: 04/26/2001, 19 y.o.   MRN: [id number]  Feeling intermittent rectal pressure; s/p Amp & Gent  T 100.4 ax, BP 140/72 FHR 150s, +accels, no decels Ctx q 2 mins with Pit at 83mu/min Cx C/C/vtx +2  IUP@39 .5wks End 1st stage Triple I  Begin pushing w/ ctx Tylenol Anticipate vag del Plans inpt Nexplanon placement for contraception  Arabella Merles CNM 04/11/2020 9:01 PM

## 2020-04-11 NOTE — Progress Notes (Signed)
Labor Progress Note Rachael Gilbert is a 19 y.o. G1P0000 at [redacted]w[redacted]d presented for SOL.  S: Epidural just placed. Good pain control. Very mild pelvic pressure. Mild GERD sxs that get better with treatment.  O:  BP 103/69   Pulse 100   Temp 98.8 F (37.1 C) (Axillary)   Resp 18   LMP 07/14/2019   SpO2 96%  EFM: 160/moderate var/pos accels, one early decel.  CVE: Dilation: 7 Effacement (%): 90 Cervical Position: Middle Station: -2, -1 Presentation: Vertex Exam by:: Devra Dopp, RN    A&P: 19 y.o. G1P0000 [redacted]w[redacted]d here for SOL.  #Labor: Progressing well. Manage expectantly.  Will not AROM until has gotten GBS prophylaxis for at least 4 hours. #Pain: epidural in place #FWB: Cat I #GBS positive Amp running.   Mirian Mo, MD 9:42 AM

## 2020-04-11 NOTE — MAU Note (Signed)
Pt reports to MAU c/o ctx every 5 min. No bleeding. Pt reports some possible LOF unsure of what time. Pt reports the fluid is clear. Pain is a 10/10.

## 2020-04-11 NOTE — Discharge Instructions (Signed)

## 2020-04-11 NOTE — Progress Notes (Signed)
Labor Progress Note Rachael Gilbert is a 19 y.o. G1P0000 at [redacted]w[redacted]d presented for SOL.  S: slept comfortably since last check. Tolerating contractions well. No new complaints.  O:  BP 96/73   Pulse (!) 126   Temp 99.1 F (37.3 C) (Axillary)   Resp 16   Ht 5\' 3"  (1.6 m)   Wt 89.4 kg   LMP 07/14/2019   SpO2 96%   BMI 34.90 kg/m  EFM: 140/moderate var/pos accels, no decels  CVE: Dilation: 7.5 Effacement (%): 100 Cervical Position: Anterior Station: -1 Presentation: Vertex Exam by:: Dr. [email or web address]   A&P: 19 y.o. G1P0000 [redacted]w[redacted]d here for SOL.  #Labor: Progressing well. AROM at 1440. Manage expectantly. #Pain: epidural in place #FWB: Cat I #GBS positive Amp running.   [redacted]w[redacted]d, MD 2:43 PM

## 2020-04-11 NOTE — Progress Notes (Addendum)
Labor Progress Note Rachael Gilbert is a 19 y.o. G1P0000 at [redacted]w[redacted]d presented for SOL.  S: Epidural working well. Mild cramping with contractions. Does not feel feverish but has noticed mild chills.    O:  BP 112/66   Pulse (!) 101   Temp (!) 100.4 F (38 C) (Axillary)   Resp 20   Ht 5\' 3"  (1.6 m)   Wt 89.4 kg   LMP 07/14/2019   SpO2 96%   BMI 34.90 kg/m  EFM: 150/moderate var/pos accels, variable and early decels  CVE: Dilation: 8.5 Effacement (%): 100 Cervical Position: Anterior Station: -1 Presentation: Vertex Exam by:: [email or web address], RN    A&P: 19 y.o. G1P0000 [redacted]w[redacted]d here for SOL.  #Labor: Progressing well. AROM at 1440. Pit started at 1646. Continue pit for now.   #triple I: maternal fever of 100.4. no fetal tachycardia.  Mom already on amp, will start [redacted]w[redacted]d. #Pain: epidural in place #FWB: Cat II, occasional variable decels, and early decels good return to baseline and good variability.  #GBS positive Amp running.   [place], MD 6:48 PM

## 2020-04-11 NOTE — H&P (Addendum)
OBSTETRIC ADMISSION HISTORY AND PHYSICAL  Rachael Gilbert is a 19 y.o. female G1P0000 with IUP at 47w5dby 569w2d/S  presenting for spontaneous onset of labor. She presented to MAU yesterday for contractions. She was discharged home with instructions for therapeutic rest with nubain and phenergan after making no change for over an hour. She returned to MAU last night for increased frequency of contractions q2 minutes. She progressed from 1-2cm dilated yesterday morning to 7cm this morning in MAU and admitted to L&D. She reports +FMs, No LOF, no VB, no blurry vision, headaches or peripheral edema, and RUQ pain.  She plans on breast feeding. She is deciding between depo and nexplanon for birth control. [facility]67[facility] --->  Estimated Date of Delivery: 04/13/20  Sono:    _0 , CWD, normal anatomy, breech presentation, anterior placenta, 543g, 19% EFW  Prenatal History/Complications: Chlamydia in 1st trimester- negative TOC  Past Medical History: Past Medical History:  Diagnosis Date   Asthma    Chlamydia infection    UTI (urinary tract infection)    Vitiligo     Past Surgical History: Past Surgical History:  Procedure Laterality Date   NO PAST SURGERIES     WISDOM TOOTH EXTRACTION      Obstetrical History: OB History     Gravida  1   Para  0   Term  0   Preterm  0   AB  0   Living  0      SAB  0   TAB  0   Ectopic  0   Multiple  0   Live Births              Social History Social History   Socioeconomic History   Marital status: Single    Spouse name: Not on file   Number of children: Not on file   Years of education: Not on file   Highest education level: Not on file  Occupational History   Not on file  Tobacco Use   Smoking status: Never Smoker   Smokeless tobacco: Never Used  Vaping Use   Vaping Use: Never used  Substance and Sexual Activity   Alcohol use: Not Currently    Drug use: Not Currently    Types: Marijuana    Comment: last used in 2020   Sexual activity: Yes    Birth control/protection: None  Other Topics Concern   Not on file  Social History Narrative   Not on file   Social Determinants of Health   Financial Resource Strain:    Difficulty of Paying Living Expenses: Not on file  Food Insecurity: No Food Insecurity   Worried About Running Out of Food in the Last Year: Never true   [place] the Last Year: Never true  Transportation Needs: No Transportation Needs   Lack of Transportation (Medical): No   Lack of Transportation (Non-Medical): No  Physical Activity:    Days of Exercise per Week: Not on file   Minutes of Exercise per Session: Not on file  Stress:    Feeling of Stress : Not on file  Social Connections:    Frequency of Communication with Friends and Family: Not on file   Frequency of Social Gatherings with Friends and Family: Not on file   Attends Religious Services: Not on file   Active Member of Clubs or Organizations: Not on file   Attends  Music therapist: Not on file   Marital Status: Not on file    Family History: Family History  Problem Relation Age of Onset   Bipolar disorder Mother    Heart disease Mother    Hypertension Mother    Bipolar disorder Father     Allergies: No Known Allergies  Medications Prior to Admission  Medication Sig Dispense Refill Last Dose   famotidine (PEPCID) 20 MG tablet Take 1 tablet (20 mg total) by mouth 2 (two) times daily. 60 tablet 0 Past Week at Unknown time   prenatal vitamin w/FE, FA (NATACHEW) 29-1 MG CHEW chewable tablet Chew 1 tablet by mouth daily at 12 noon. 30 tablet 11 04/10/2020 at Unknown time   Blood Pressure Monitoring (BLOOD PRESSURE KIT) DEVI 1 Device by Does not apply route as needed. 1 each 0    Elastic Bandages & Supports (COMFORT FIT MATERNITY SUPP LG) MISC 1 Units by Does not apply route daily as needed. 1 each  0    fexofenadine (ALLEGRA) 180 MG tablet Take 180 mg by mouth daily as needed for allergies.    Unknown at Unknown time   lidocaine (XYLOCAINE) 5 % ointment Apply 1 application topically as needed. 35.44 g 2 Unknown at Unknown time     Review of Systems   All systems reviewed and negative except as stated in HPI  Last menstrual period 07/14/2019, unknown if currently breastfeeding. General appearance: alert, cooperative, appears stated age and mild distress Lungs: normal work of breathing Abdomen: gravid Pelvic: 7/90/-2 per RN  Presentation: cephalic Fetal monitoringBaseline: 145 bpm, Variability: Good {> 6 bpm), +accels, no decels Uterine activityFrequency: Every 5 minutes Dilation: 7 Effacement (%): 90 Exam by:: Maryagnes Amos RN   Prenatal labs: ABO, Rh: B/Positive/-- (02/22 1039) Antibody: Negative (02/22 1039) Rubella: 2.76 (02/22 1039) RPR: Non Reactive (06/08 0911)  HBsAg: Negative (02/22 1039)  HIV: Non Reactive (06/08 0911)  GBS: Positive/-- (08/06 0851)  1 hr Glucola: 81 Genetic screening: NIPS: low risk, female. AFP: negative Anatomy US: normal   Prenatal Transfer Tool  Maternal Diabetes: No Genetic Screening: Normal Maternal Ultrasounds/Referrals: Normal Fetal Ultrasounds or other Referrals:  Referred to Materal Fetal Medicine  Maternal Substance Abuse:  No Significant Maternal Medications:  None Significant Maternal Lab Results: Group B Strep positive  Results for orders placed or performed during the hospital encounter of 04/10/20 (from the past 24 hour(s))  CBC   Collection Time: 04/10/20  9:11 PM  Result Value Ref Range   WBC 13.7 (H) 4.0 - 10.5 K/uL   RBC 3.84 (L) 3.87 - 5.11 MIL/uL   Hemoglobin 10.8 (L) 12.0 - 15.0 g/dL   HCT 33.2 (L) 36 - 46 %   MCV 86.5 80.0 - 100.0 fL   MCH 28.1 26.0 - 34.0 pg   MCHC 32.5 30.0 - 36.0 g/dL   RDW 14.5 11.5 - 15.5 %   Platelets 237 150 - 400 K/uL   nRBC 0.0 0.0 - 0.2 %  Comprehensive metabolic panel    Collection Time: 04/10/20  9:11 PM  Result Value Ref Range   Sodium 132 (L) 135 - 145 mmol/L   Potassium 4.0 3.5 - 5.1 mmol/L   Chloride 102 98 - 111 mmol/L   CO2 19 (L) 22 - 32 mmol/L   Glucose, Bld 89 70 - 99 mg/dL   BUN 6 6 - 20 mg/dL   Creatinine, Ser 0.64 0.44 - 1.00 mg/dL   Calcium 9.0 8.9 - 10.3 mg/dL  Total Protein 6.5 6.5 - 8.1 g/dL   Albumin 2.6 (L) 3.5 - 5.0 g/dL   AST 28 15 - 41 U/L   ALT 17 0 - 44 U/L   Alkaline Phosphatase 175 (H) 38 - 126 U/L   Total Bilirubin 0.6 0.3 - 1.2 mg/dL   GFR calc non Af Amer >60 >60 mL/min   GFR calc Af Amer >60 >60 mL/min   Anion gap 11 5 - 15  Protein / creatinine ratio, urine   Collection Time: 04/10/20 10:10 PM  Result Value Ref Range   Creatinine, Urine 173.79 mg/dL   Total Protein, Urine 30 mg/dL   Protein Creatinine Ratio 0.17 (H) 0.00 - 0.15 mg/mg[Cre]  Results for orders placed or performed during the hospital encounter of 04/10/20 (from the past 24 hour(s))  POCT fern test   Collection Time: 04/10/20 11:16 AM  Result Value Ref Range   POCT Fern Test Negative = intact amniotic membranes   Fern Test   Collection Time: 04/10/20 12:13 PM  Result Value Ref Range   POCT Fern Test Negative = intact amniotic membranes     Patient Active Problem List   Diagnosis Date Noted   Positive GBS test 03/26/2020   Supervision of low-risk pregnancy 09/27/2019   Chlamydia trachomatis infection in mother during first trimester of pregnancy 08/18/2019   Bacterial vaginosis 08/14/2019   Morning sickness 08/14/2019    Assessment/Plan:  TINEKA URIEGAS is a 19 y.o. G1P0000 at 66w5dhere for spontaneous onset of labor.  #Labor: Patient presented to MAU for increased contractions and bloody show. [facility] for 7/90/-2 with bulging bag on most recent exam. Will plan to manage expectantly and augment as clinically indicated. #Pain: Plan for epidural on admission per pt request. #FWB: +fetal movement, vertex, Cat I strip #ID:  GBS+, on ampicillin  #MOF: Breast #MOC: Depo vs. nexplanon #Circ:  Yes--desires prior to discharge  ASharion Settler DO  04/11/2020, 7:07 AM  Attestation of Supervision of Student:  I confirm that I have verified the information documented in the  resident's  note and that I have also personally reperformed the history, physical exam and all medical decision making activities.  I have verified that all services and findings are accurately documented in this student's note; and I agree with management and plan as outlined in the documentation. I have also made any necessary editorial changes.  ARanda Ngo MBellefor WSan [facility] CKountze8/25/2021 7:43 AM

## 2020-04-11 NOTE — Discharge Summary (Addendum)
Postpartum Discharge Summary  04/13/20     Patient Name: Rachael Gilbert DOB: 07/30/2001 MRN: [id number]  Date of admission: 04/11/2020 Delivery date:04/11/2020  Delivering provider: Sharion Settler  Date of discharge: 04/13/2020  Admitting diagnosis: Encounter for induction of labor [Z34.90] Intrauterine pregnancy: [redacted]w[redacted]d    Secondary diagnosis:  Active Problems:   Chlamydia trachomatis infection in mother during first trimester of pregnancy   Positive GBS test  Additional problems: none    Discharge diagnosis: Term Pregnancy Delivered                                              Post partum procedures:  postpartum Nexplanon placement Augmentation: AROM and Pitocin Complications: Intrauterine Inflammation or infection (Chorioamniotis)  Hospital course: Onset of Labor With Vaginal Delivery      19y.o. yo G1P0000 at 20w5das admitted in Active Labor on 04/11/2020. Patient had a labor course remarkable for being given Amp initially for GBS ppx and being in advanced labor (7/90/-2), as well as an epidural placement. She then had AROM followed by Pitocin as augmentation, and then later that evening she became febrile and a dose of GeNorva Karvonenas given for Triple I tx. She then progressed to complete and pushed well to vag del.   Membrane Rupture Time/Date: 2:38 PM ,04/11/2020   Delivery Method:Vaginal, Spontaneous  Episiotomy: None  Lacerations:  1st degree;Periurethral  Patient had an uncomplicated postpartum course.  She is ambulating, tolerating a regular diet, passing flatus, and urinating well. Patient is discharged home in stable condition on 04/13/20.  Newborn Data: Birth date:04/11/2020  Birth time:11:11 PM  Gender:Female  Living status:Living  Apgars:2 ,7  Weight:3535 g  3535gm (7lb 12.7oz)  Magnesium Sulfate received: No BMZ received: No Rhophylac:N/A MMR:N/A T-DaP:Given prenatally Flu: No Transfusion:No  Physical exam  Vitals:   04/12/20 1418 04/12/20 1716  04/12/20 2120 04/13/20 0546  BP: 125/74 138/83 120/73 113/70  Pulse: 98 (!) 106 (!) 101 91  Resp: _0 Temp: 98.1 F (36.7 C) 97.8 F (36.6 C) 98.1 F (36.7 C) 98.1 F (36.7 C)  TempSrc: Oral  Axillary Oral  SpO2:  100%  100%  Weight:      Height:       General: alert, cooperative and no distress Lochia: appropriate Uterine Fundus: firm Incision: N/A DVT Evaluation: No evidence of DVT seen on physical exam. Labs: Lab Results  Component Value Date   WBC 17.5 (H) 04/11/2020   HGB 11.7 (L) 04/11/2020   HCT 35.9 (L) 04/11/2020   MCV 88.6 04/11/2020   PLT 265 04/11/2020   CMP Latest Ref Rng & Units 04/10/2020  Glucose 70 - 99 mg/dL 89  BUN 6 - 20 mg/dL 6  Creatinine 0.44 - 1.00 mg/dL 0.64  Sodium 135 - 145 mmol/L 132(L)  Potassium 3.5 - 5.1 mmol/L 4.0  Chloride 98 - 111 mmol/L 102  CO2 22 - 32 mmol/L 19(L)  Calcium 8.9 - 10.3 mg/dL 9.0  Total Protein 6.5 - 8.1 g/dL 6.5  Total Bilirubin 0.3 - 1.2 mg/dL 0.6  Alkaline Phos 38 - 126 U/L 175(H)  AST 15 - 41 U/L 28  ALT 0 - 44 U/L 17   Edinburgh Score: Edinburgh Postnatal Depression Scale Screening Tool 04/12/2020  I have been able to laugh and see the funny side of things. (No Data)  After visit meds:  Allergies as of 04/13/2020   No Known Allergies      Medication List     STOP taking these medications    lidocaine 5 % ointment Commonly known as: XYLOCAINE       TAKE these medications    acetaminophen 325 MG tablet Commonly known as: Tylenol Take 2 tablets (650 mg total) by mouth every 6 (six) hours. What changed:  how much to take when to take this reasons to take this   Blood Pressure Kit Devi 1 Device by Does not apply route as needed.   coconut oil Oil Apply 1 application topically as needed.   Comfort Fit Maternity Supp Lg Misc 1 Units by Does not apply route daily as needed.   famotidine 20 MG tablet Commonly known as: PEPCID Take 1 tablet (20 mg total) by mouth 2 (two) times  daily.   ibuprofen 600 MG tablet Commonly known as: ADVIL Take 1 tablet (600 mg total) by mouth every 6 (six) hours.   prenatal vitamin w/FE, FA 29-1 MG Chew chewable tablet Chew 1 tablet by mouth daily at 12 noon.         Discharge home in stable condition Infant Feeding: Bottle Infant Disposition:home with mother Discharge instruction: per After Visit Summary and Postpartum booklet. Activity: Advance as tolerated. Pelvic rest for 6 weeks.  Diet: routine diet Future Appointments:No future appointments. Follow up Visit:   Myrtis Ser, CNM  P [facility] Admin Pool Please schedule this patient for Postpartum visit in: 4 weeks with the following provider: Any provider  In-Person  For C/S patients schedule nurse incision check in weeks 2 weeks: no  Low risk pregnancy complicated by: none  Delivery mode:  SVD  Anticipated Birth Control:  PP Nexplanon placed PP Procedures needed: none  Schedule Integrated [place] visit: no   04/13/2020 Sharion Settler, DO  I personally saw and evaluated the patient, performing the key elements of the service. I developed and verified the management plan that is described in the resident's/student's note, and I agree with the content with my edits above. VSS, HRR&R, Resp unlabored, Legs neg.  Nigel Berthold, CNM 04/13/2020 7:26 AM

## 2020-04-12 ENCOUNTER — Encounter (HOSPITAL_COMMUNITY): Payer: Self-pay | Admitting: Family Medicine

## 2020-04-12 DIAGNOSIS — Z30017 Encounter for initial prescription of implantable subdermal contraceptive: Secondary | ICD-10-CM

## 2020-04-12 MED ORDER — ETONOGESTREL 68 MG ~~LOC~~ IMPL
68.0000 mg | DRUG_IMPLANT | Freq: Once | SUBCUTANEOUS | Status: AC
  Administered 2020-04-12: 68 mg via SUBCUTANEOUS
  Filled 2020-04-12: qty 1

## 2020-04-12 MED ORDER — SENNOSIDES-DOCUSATE SODIUM 8.6-50 MG PO TABS
2.0000 | ORAL_TABLET | ORAL | Status: DC
  Administered 2020-04-12 – 2020-04-14 (×3): 2 via ORAL
  Filled 2020-04-12 (×3): qty 2

## 2020-04-12 MED ORDER — DIPHENHYDRAMINE HCL 25 MG PO CAPS: 25.0000 mg | ORAL_CAPSULE | Freq: Four times a day (QID) | ORAL | Status: DC | PRN

## 2020-04-12 MED ORDER — IBUPROFEN 600 MG PO TABS
600.0000 mg | ORAL_TABLET | Freq: Four times a day (QID) | ORAL | Status: DC
  Administered 2020-04-12 – 2020-04-14 (×8): 600 mg via ORAL
  Filled 2020-04-12 (×7): qty 1

## 2020-04-12 MED ORDER — PRENATAL MULTIVITAMIN CH
1.0000 | ORAL_TABLET | Freq: Every day | ORAL | Status: DC
  Administered 2020-04-12 – 2020-04-13 (×2): 1 via ORAL
  Filled 2020-04-12 (×2): qty 1

## 2020-04-12 MED ORDER — COCONUT OIL OIL: 1.0000 "application " | TOPICAL_OIL | Status: DC | PRN

## 2020-04-12 MED ORDER — DIBUCAINE (PERIANAL) 1 % EX OINT: 1.0000 "application " | TOPICAL_OINTMENT | CUTANEOUS | Status: DC | PRN

## 2020-04-12 MED ORDER — ZOLPIDEM TARTRATE 5 MG PO TABS: 5.0000 mg | ORAL_TABLET | Freq: Every evening | ORAL | Status: DC | PRN

## 2020-04-12 MED ORDER — ACETAMINOPHEN 325 MG PO TABS
650.0000 mg | ORAL_TABLET | Freq: Four times a day (QID) | ORAL | Status: DC
  Administered 2020-04-12 – 2020-04-14 (×9): 650 mg via ORAL
  Filled 2020-04-12 (×9): qty 2

## 2020-04-12 MED ORDER — SIMETHICONE 80 MG PO CHEW: 80.0000 mg | CHEWABLE_TABLET | ORAL | Status: DC | PRN

## 2020-04-12 MED ORDER — TETANUS-DIPHTH-ACELL PERTUSSIS 5-2.5-18.5 LF-MCG/0.5 IM SUSP: 0.5000 mL | Freq: Once | INTRAMUSCULAR | Status: DC

## 2020-04-12 MED ORDER — ONDANSETRON HCL 4 MG PO TABS: 4.0000 mg | ORAL_TABLET | ORAL | Status: DC | PRN

## 2020-04-12 MED ORDER — WITCH HAZEL-GLYCERIN EX PADS: 1.0000 "application " | MEDICATED_PAD | CUTANEOUS | Status: DC | PRN

## 2020-04-12 MED ORDER — BENZOCAINE-MENTHOL 20-0.5 % EX AERO
1.0000 "application " | INHALATION_SPRAY | CUTANEOUS | Status: DC | PRN
  Administered 2020-04-13: 1 via TOPICAL
  Filled 2020-04-12 (×2): qty 56

## 2020-04-12 MED ORDER — IBUPROFEN 600 MG PO TABS
600.0000 mg | ORAL_TABLET | Freq: Three times a day (TID) | ORAL | Status: DC
  Administered 2020-04-12 (×2): 600 mg via ORAL
  Filled 2020-04-12 (×3): qty 1

## 2020-04-12 MED ORDER — LIDOCAINE HCL 1 % IJ SOLN
0.0000 mL | Freq: Once | INTRAMUSCULAR | Status: AC | PRN
  Administered 2020-04-12: 20 mL via INTRADERMAL
  Filled 2020-04-12: qty 20

## 2020-04-12 MED ORDER — ONDANSETRON HCL 4 MG/2ML IJ SOLN: 4.0000 mg | INTRAMUSCULAR | Status: DC | PRN

## 2020-04-12 NOTE — Lactation Note (Signed)
This note was copied from a baby's chart. Lactation Consultation Note  Patient Name: Rachael Gilbert RXYVO'P Date: 04/12/2020  Mom is a G1P1 with baby Rachael Cyiere at 34 hours old.  Mom reports she took a child birth class during pregnancy. Mom reports she has decided to formula feed. Mom reports breastfeeding is too hard. Discussed how it gets easier and easier for most moms and babes.  But that it is hard in beginning.  Discussed pumping and offering breastmilk in bottles.Mom declined at this time.  Mom does not have a pump for home use.  Let her know we can give manual pump.  Mom on [facility] in [place].   Reviewed and left [facility] breastfeeding handout.  Urged to call lactation as needed.   Maternal Data    Feeding    LATCH Score                   Interventions    Lactation Tools Discussed/Used     Consult Status      Rachael Gilbert 04/12/2020, 7:42 PM

## 2020-04-12 NOTE — Anesthesia Postprocedure Evaluation (Signed)
Anesthesia Post Note  Patient: Rachael Gilbert  Procedure(s) Performed: AN AD HOC LABOR EPIDURAL     Patient location during evaluation: Mother Baby Anesthesia Type: Epidural Level of consciousness: awake, awake and alert and oriented Pain management: pain level controlled Vital Signs Assessment: post-procedure vital signs reviewed and stable Respiratory status: spontaneous breathing and respiratory function stable Cardiovascular status: blood pressure returned to baseline Postop Assessment: no headache, epidural receding, patient able to bend at knees, adequate PO intake, no backache, no apparent nausea or vomiting and able to ambulate Anesthetic complications: no   No complications documented.  Last Vitals:  Vitals:   04/12/20 0234 04/12/20 0648  BP: 122/78 107/66  Pulse: (!) 115 100  Resp: 20 18  Temp: 37.3 C 36.9 C  SpO2: 99% 100%    Last Pain:  Vitals:   04/12/20 0648  TempSrc: Oral  PainSc: 0-No pain   Pain Goal:                Epidural/Spinal Function Cutaneous sensation: Normal sensation (04/12/20 0648), Patient able to flex knees: Yes (04/12/20 6546), Patient able to lift hips off bed: Yes (04/12/20 5035), Back pain beyond tenderness at insertion site: No (04/12/20 0648), Progressively worsening motor and/or sensory loss: No (04/12/20 4656), Bowel and/or bladder incontinence post epidural: No (04/12/20 0648)  Cleda Clarks

## 2020-04-12 NOTE — Procedures (Signed)
No contraindications for placement.  No liver disease, no unexplained vaginal bleeding, no h/o breast cancer, no h/o blood clots.  Risks & benefits of Nexplanon discussed Packaging instructions supplied to patient Consent form signed  No current facility-administered medications on file prior to encounter.   Current Outpatient Medications on File Prior to Encounter  Medication Sig Dispense Refill  . acetaminophen (TYLENOL) 325 MG tablet Take 650-975 mg by mouth every 6 (six) hours as needed for mild pain or headache.    . famotidine (PEPCID) 20 MG tablet Take 1 tablet (20 mg total) by mouth 2 (two) times daily. 60 tablet 0  . prenatal vitamin w/FE, FA (NATACHEW) 29-1 MG CHEW chewable tablet Chew 1 tablet by mouth daily at 12 noon. 30 tablet 11  . Blood Pressure Monitoring (BLOOD PRESSURE KIT) DEVI 1 Device by Does not apply route as needed. 1 each 0  . Elastic Bandages & Supports (COMFORT FIT MATERNITY SUPP LG) MISC 1 Units by Does not apply route daily as needed. 1 each 0  . lidocaine (XYLOCAINE) 5 % ointment Apply 1 application topically as needed. (Patient not taking: Reported on 04/12/2020) 35.44 g 2    The patient denies any allergies to anesthetics or antiseptics.  Patient Active Problem List   Diagnosis Date Noted  . Encounter for induction of labor 04/11/2020  . Positive GBS test 03/26/2020  . Supervision of low-risk pregnancy 09/27/2019  . Chlamydia trachomatis infection in mother during first trimester of pregnancy 08/18/2019  . Bacterial vaginosis 08/14/2019  . Morning sickness 08/14/2019    Procedure: Pt was placed in supine position. Left arm was flexed at the elbow and externally rotated so that her wrist was parallel to her ear The medial epicondyle of the left arm was identified The insertions site was marked 8 cm proximal to the medial epicondyle The insertion site was cleaned with Betadine The area surrounding the insertion site was covered with a sterile  drape 1% lidocaine was injected just under the skin at the insertion site extending 4 cm proximally. The sterile preloaded disposable Nexaplanon applicator was removed from the sterile packaging The applicator needle was inserted at a 30 degree angle at 8 cm proximal to the medial epicondyle as marked The applicator was lowered to a horizontal position and advanced just under the skin for the full length of the needle The slider on the applicator was retracted fully while the applicator remained in the same position, then the applicator was removed. The implant was confirmed via palpation as being in position The implant position was demonstrated to the patient Pressure dressing was applied to the patient.  The patient was instructed to removed the pressure dressing in 24 hrs.  The patient was advised to move slowly from a supine to an upright position  The patient denied any concerns or complaints  Matilde Haymaker, MD

## 2020-04-12 NOTE — Progress Notes (Addendum)
Post Partum Day 1  Subjective:  Rachael Gilbert is a 19 y.o. G1P1001 [redacted]w[redacted]d s/p SVD.  No acute events overnight.  Pt denies problems with ambulating, voiding or po intake.  She denies nausea or vomiting.  Pain is well controlled.  She has had flatus. She has had bowel movement.  Lochia Moderate.  Plan for birth control is nexplanon.  Method of Feeding: Breast  Objective: BP 107/66 (BP Location: Right Arm)   Pulse 100   Temp 98.5 F (36.9 C) (Oral)   Resp 18   Ht 5\' 3"  (1.6 m)   Wt 89.4 kg   LMP 07/14/2019   SpO2 100%   Breastfeeding Unknown   BMI 34.90 kg/m   Physical Exam:  General: alert, cooperative and no distress Lochia:normal flow Chest: CTAB Heart: RRR no m/r/g Abdomen: +BS, soft, nontender, fundus firm at/below umbilicus Uterine Fundus: firm, umbilicus DVT Evaluation: No evidence of DVT seen on physical exam. Extremities: no edema  Recent Labs    04/10/20 2111 04/11/20 0654  HGB 10.8* 11.7*  HCT 33.2* 35.9*    Assessment/Plan:  ASSESSMENT: Rachael Gilbert is a 19 y.o. G1P1001 [redacted]w[redacted]d ppd #1 s/p NSVD doing well.   Postpartum care -MOF: breast -MOC: nexplanon -Circ: yes -Dispo: likely tomorrow   LOS: 1 day   [redacted]w[redacted]d 04/12/2020, 7:47 AM

## 2020-04-13 MED ORDER — COCONUT OIL OIL: 1.0000 "application " | TOPICAL_OIL | 0 refills | Status: DC | PRN

## 2020-04-13 MED ORDER — IBUPROFEN 600 MG PO TABS
600.0000 mg | ORAL_TABLET | Freq: Four times a day (QID) | ORAL | 0 refills | Status: DC
Start: 2020-04-13 — End: 2020-08-17

## 2020-04-13 MED ORDER — ACETAMINOPHEN 325 MG PO TABS
650.0000 mg | ORAL_TABLET | Freq: Four times a day (QID) | ORAL | 0 refills | Status: DC
Start: 2020-04-13 — End: 2020-08-17

## 2020-04-13 NOTE — Progress Notes (Signed)
Per Resident on call [facility]) mother delivered less than one hour before midnight on 8/25 and this is why they would like for her to stay as a couplet with infant until 8/28.  Wallie Renshaw, charge RN aware.

## 2020-04-14 DIAGNOSIS — Z975 Presence of (intrauterine) contraceptive device: Secondary | ICD-10-CM

## 2020-04-14 DIAGNOSIS — O411290 Chorioamnionitis, unspecified trimester, not applicable or unspecified: Secondary | ICD-10-CM | POA: Diagnosis not present

## 2020-04-14 NOTE — Discharge Instructions (Signed)

## 2020-04-14 NOTE — Discharge Summary (Signed)
Postpartum Discharge Summary     Patient Name: Rachael Gilbert DOB: 05/13/2001 MRN: [id number]  Date of admission: 04/11/2020 Delivery date:04/11/2020  Delivering provider: Sharion Settler  Date of discharge: 04/14/2020  Admitting diagnosis: Encounter for induction of labor [Z34.90] Intrauterine pregnancy: [redacted]w[redacted]d    Secondary diagnosis:  Active Problems:   Bacterial vaginosis   Chlamydia trachomatis infection in mother during first trimester of pregnancy   Supervision of low-risk pregnancy   Positive GBS test   Encounter for induction of labor   Chorioamnionitis   Nexplanon in place  Additional problems: none    Discharge diagnosis: Term Pregnancy Delivered                                              Post partum procedures:  postpartum Nexplanon placement Augmentation: AROM and Pitocin Complications: Intrauterine Inflammation or infection (Chorioamniotis)  Hospital course: Onset of Labor With Vaginal Delivery      19y.o. yo G1P0000 at 325w5das admitted in Active Labor on 04/11/2020. Patient had a labor course remarkable for being given Amp initially for GBS ppx and being in advanced labor (7/90/-2), as well as an epidural placement. She then had AROM followed by Pitocin as augmentation, and then later that evening she became febrile and a dose of GeNorva Karvonenas given for Triple I tx. She then progressed to complete and pushed well to vag del.   Membrane Rupture Time/Date: 2:38 PM ,04/11/2020   Delivery Method:Vaginal, Spontaneous  Episiotomy: None  Lacerations:  1st degree;Periurethral  Patient had an uncomplicated postpartum course.  She is ambulating, tolerating a regular diet, passing flatus, and urinating well. Patient is discharged home in stable condition on 04/14/20.  Newborn Data: Birth date:04/11/2020  Birth time:11:11 PM  Gender:Female  Living status:Living  Apgars:2 ,7  Weight:3535 g  3535gm (7lb 12.7oz)  Magnesium Sulfate received: No BMZ received:  No Rhophylac:N/A MMR:N/A T-DaP:Given prenatally Flu: No Transfusion:No  Physical exam  Vitals:   04/12/20 2120 04/13/20 0546 04/13/20 1841 04/13/20 2055  BP: 120/73 113/70 109/73 130/76  Pulse: (!) 101 91 90 83  Resp:  _0 Temp: 98.1 F (36.7 C) 98.1 F (36.7 C) 97.8 F (36.6 C) 98.9 F (37.2 C)  TempSrc: Axillary Oral Oral Oral  SpO2:  100% 99% 99%  Weight:      Height:       General: alert, cooperative and no distress Lochia: appropriate Uterine Fundus: firm Incision: N/A DVT Evaluation: No evidence of DVT seen on physical exam. Labs: Lab Results  Component Value Date   WBC 17.5 (H) 04/11/2020   HGB 11.7 (L) 04/11/2020   HCT 35.9 (L) 04/11/2020   MCV 88.6 04/11/2020   PLT 265 04/11/2020   CMP Latest Ref Rng & Units 04/10/2020  Glucose 70 - 99 mg/dL 89  BUN 6 - 20 mg/dL 6  Creatinine 0.44 - 1.00 mg/dL 0.64  Sodium 135 - 145 mmol/L 132(L)  Potassium 3.5 - 5.1 mmol/L 4.0  Chloride 98 - 111 mmol/L 102  CO2 22 - 32 mmol/L 19(L)  Calcium 8.9 - 10.3 mg/dL 9.0  Total Protein 6.5 - 8.1 g/dL 6.5  Total Bilirubin 0.3 - 1.2 mg/dL 0.6  Alkaline Phos 38 - 126 U/L 175(H)  AST 15 - 41 U/L 28  ALT 0 - 44 U/L 17   Edinburgh Score: EdFlavia Shipperostnatal  Depression Scale Screening Tool 04/13/2020  I have been able to laugh and see the funny side of things. 0  I have looked forward with enjoyment to things. 0  I have blamed myself unnecessarily when things went wrong. 0  I have been anxious or worried for no good reason. 0  I have felt scared or panicky for no good reason. 0  Things have been getting on top of me. 0  I have been so unhappy that I have had difficulty sleeping. 0  I have felt sad or miserable. 0  I have been so unhappy that I have been crying. 0  The thought of harming myself has occurred to me. 0  Edinburgh Postnatal Depression Scale Total 0     After visit meds:  Allergies as of 04/14/2020   No Known Allergies     Medication List    STOP  taking these medications   lidocaine 5 % ointment Commonly known as: XYLOCAINE     TAKE these medications   acetaminophen 325 MG tablet Commonly known as: Tylenol Take 2 tablets (650 mg total) by mouth every 6 (six) hours. What changed:   how much to take  when to take this  reasons to take this   Blood Pressure Kit Devi 1 Device by Does not apply route as needed.   coconut oil Oil Apply 1 application topically as needed.   Comfort Fit Maternity Supp Lg Misc 1 Units by Does not apply route daily as needed.   famotidine 20 MG tablet Commonly known as: PEPCID Take 1 tablet (20 mg total) by mouth 2 (two) times daily.   ibuprofen 600 MG tablet Commonly known as: ADVIL Take 1 tablet (600 mg total) by mouth every 6 (six) hours.   prenatal vitamin w/FE, FA 29-1 MG Chew chewable tablet Chew 1 tablet by mouth daily at 12 noon.        Discharge home in stable condition Infant Feeding: Bottle Infant Disposition:home with mother Discharge instruction: per After Visit Summary and Postpartum booklet. Activity: Advance as tolerated. Pelvic rest for 6 weeks.  Diet: routine diet Future Appointments:No future appointments. Follow up Visit:  Myrtis Ser, CNM  P [facility] Admin Pool Please schedule this patient for Postpartum visit in: 4 weeks with the following provider: Any provider  In-Person  For C/S patients schedule nurse incision check in weeks 2 weeks: no  Low risk pregnancy complicated by: none  Delivery mode:  SVD  Anticipated Birth Control:  PP Nexplanon placed PP Procedures needed: none  Schedule Integrated BH visit: no   04/19/4096 Arrie Senate, MD

## 2020-04-15 ENCOUNTER — Other Ambulatory Visit: Payer: Self-pay | Admitting: Family Medicine

## 2020-04-15 DIAGNOSIS — R12 Heartburn: Secondary | ICD-10-CM

## 2020-04-20 ENCOUNTER — Inpatient Hospital Stay (HOSPITAL_COMMUNITY)
Admission: AD | Admit: 2020-04-20 | Payer: Medicaid Other | Source: Home / Self Care | Admitting: Obstetrics and Gynecology

## 2020-04-20 ENCOUNTER — Inpatient Hospital Stay (HOSPITAL_COMMUNITY): Payer: Medicaid Other

## 2020-05-15 ENCOUNTER — Encounter: Payer: Self-pay | Admitting: Nurse Practitioner

## 2020-05-15 ENCOUNTER — Other Ambulatory Visit: Payer: Self-pay

## 2020-05-15 ENCOUNTER — Ambulatory Visit (INDEPENDENT_AMBULATORY_CARE_PROVIDER_SITE_OTHER): Payer: Medicaid Other | Admitting: Nurse Practitioner

## 2020-05-15 NOTE — Progress Notes (Signed)
    Post Partum Visit Note  Rachael Gilbert is a 19 y.o. G45P1001 female who presents for a postpartum visit. She is 4 weeks postpartum following a normal spontaneous vaginal delivery.  I have fully reviewed the prenatal and intrapartum course - spontaneous labor with AROM and augmentation, developed fever and treated for chorioamnionitis. The delivery was at 39/5 gestational weeks with a 1% periurethral tear.  Anesthesia: epidural. Postpartum course has been good. Baby is doing well. Baby is feeding by bottle - Carn[employer] Good Start. Bleeding staining only. Bowel function is normal. Bladder function is normal. Patient is not sexually active. Contraception method is Nexplanon. Postpartum depression screening: negative.   The pregnancy intention screening data noted above was reviewed. Potential methods of contraception were discussed. The patient currently has Hormonal Implant.      The following portions of the patient's history were reviewed and updated as appropriate: allergies, current medications, past family history, past medical history, past social history, past surgical history and problem list.  Review of Systems Pertinent items noted in HPI and remainder of comprehensive ROS otherwise negative.    Objective:  unknown if currently breastfeeding.  General:  alert, cooperative and no distress   Breasts:  not performed  Lungs: clear to auscultation bilaterally  Heart:  regular rate and rhythm, S1, S2 normal, no murmur, click, rub or gallop  Abdomen: not examined   Vulva:  not evaluated  Vagina: not evaluated  Cervix:  not evaluated  Corpus: not examined  Adnexa:  not evaluated  Rectal Exam: Not performed.        Assessment:    Normal postpartum exam. Pap smear not done at today's visit.   Plan:   Essential components of care per ACOG recommendations:  1.  Mood and well being: Patient with negative depression screening today. Reviewed local resources for support.  -  Patient does not use tobacco.- hx of drug use? No   2. Infant care and feeding:  -Patient currently breastmilk feeding? No -Social determinants of health (SDOH) reviewed in EPIC. No concerns  3. Sexuality, contraception and birth spacing - Patient does not want a pregnancy in the next year.- - Discussed birth spacing of 18 months Currently has Nexplanon and reviewed the possibility of irregular spotting Nexplanon palpated in arm  4. Sleep and fatigue -Encouraged family/partner/community support of 4 hrs of uninterrupted sleep to help with mood and fatigue  5. Physical Recovery  - Discussed patients delivery and complications - Patient had a 1st degreeperiurethral laceration, perineal healing reviewed. Patient expressed understanding - Patient has urinary incontinence? No-  Patient is safe to resume physical and sexual activity  6.  Health Maintenance - Last pap smear not yet done  - due at age 49   Henrietta Dine, [facility] for [employer], [facility]  Nolene Bernheim, RN, MSN, NP-BC Nurse Practitioner, Biochemist, clinical for [employer], [facility] 05/15/2020 9:46 AM

## 2020-05-17 ENCOUNTER — Other Ambulatory Visit: Payer: Self-pay | Admitting: Family Medicine

## 2020-05-17 DIAGNOSIS — R12 Heartburn: Secondary | ICD-10-CM

## 2020-05-18 MED ORDER — FAMOTIDINE 20 MG PO TABS: 20.0000 mg | ORAL_TABLET | Freq: Two times a day (BID) | ORAL | 1 refills | Status: DC

## 2020-05-18 NOTE — Addendum Note (Signed)
Addended by: Reva Bores on: 05/18/2020 08:10 AM   Modules accepted: Orders

## 2020-06-26 ENCOUNTER — Other Ambulatory Visit: Payer: Self-pay | Admitting: Lactation Services

## 2020-06-26 MED ORDER — MEGESTROL ACETATE 40 MG PO TABS: 40.0000 mg | ORAL_TABLET | Freq: Three times a day (TID) | ORAL | 1 refills | Status: DC

## 2020-07-08 ENCOUNTER — Other Ambulatory Visit: Payer: Self-pay | Admitting: Nurse Practitioner

## 2020-08-17 ENCOUNTER — Other Ambulatory Visit: Payer: Self-pay

## 2020-08-17 ENCOUNTER — Ambulatory Visit (HOSPITAL_COMMUNITY)
Admission: EM | Admit: 2020-08-17 | Discharge: 2020-08-17 | Disposition: A | Discharge status: Home / Self Care | Payer: Medicaid Other | Attending: Emergency Medicine | Admitting: Emergency Medicine

## 2020-08-17 ENCOUNTER — Encounter (HOSPITAL_COMMUNITY): Payer: Self-pay | Admitting: Emergency Medicine

## 2020-08-17 DIAGNOSIS — U071 COVID-19: Secondary | ICD-10-CM | POA: Diagnosis not present

## 2020-08-17 DIAGNOSIS — B349 Viral infection, unspecified: Secondary | ICD-10-CM | POA: Diagnosis not present

## 2020-08-17 DIAGNOSIS — Z20822 Contact with and (suspected) exposure to covid-19: Secondary | ICD-10-CM | POA: Diagnosis not present

## 2020-08-17 LAB — RESP PANEL BY RT-PCR (FLU A&B, COVID) ARPGX2
Influenza A by PCR: NEGATIVE
Influenza B by PCR: NEGATIVE
SARS Coronavirus 2 by RT PCR: POSITIVE — AB

## 2020-08-17 MED ORDER — AEROCHAMBER PLUS MISC: 2 refills | Status: AC

## 2020-08-17 MED ORDER — IBUPROFEN 600 MG PO TABS: 600.0000 mg | ORAL_TABLET | Freq: Four times a day (QID) | ORAL | 0 refills | Status: DC | PRN

## 2020-08-17 MED ORDER — ALBUTEROL SULFATE HFA 108 (90 BASE) MCG/ACT IN AERS: 1.0000 | INHALATION_SPRAY | RESPIRATORY_TRACT | 0 refills | Status: DC | PRN

## 2020-08-17 MED ORDER — BENZONATATE 200 MG PO CAPS: 200.0000 mg | ORAL_CAPSULE | Freq: Three times a day (TID) | ORAL | 0 refills | Status: DC | PRN

## 2020-08-17 NOTE — Discharge Instructions (Addendum)
Somebody will contact you if your flu or Covid comes back positive.  I will reach out to the monoclonal body team if your Covid is positive to screen you for possible infusion.  I will prescribe Tamiflu if your flu is positive.  600 mg of ibuprofen combined with 1000 mg of Tylenol 3-4 times a day as needed for body aches, headaches, fever, 2 puffs from your albuterol inhaler using your spacer 3-4 times a day as needed for coughing, wheezing, shortness of breath.  Continue Mucinex.  Tessalon for the cough.  Below is a list of primary care practices who are taking new patients for you to follow-up with.  [facility] internal medicine clinic Ground Floor - [facility], [address] [place], [place], [place] [address] [phone]  [facility] at [facility] [address] [place], [place] [address] [phone]  [facility] [address] [place], [place] [address] [phone]  [facility] [phone] [address] [place] [address]  [facility]: [address] [place] [address]  [phone]  [facility]: [address] [place] [address]   [phone]  [facility]: [address] [place] [address]  [phone]  [facility] : [address] [place] [address] [phone]  [facility] Primary [facility]: [address] [place] [address] [phone]  [facility]: [address] [place] [address] [phone]  Dr. Oneal Grout [address] [facility] [place] [address]  [phone]  Dr. Jackie Plum, [facility]. [address] [place], [place] [address]  [phone]  Go to www.goodrx.com to look up your  medications. This will give you a list of where you can find your prescriptions at the most affordable prices. Or ask the pharmacist what the cash price is, or if they have any other discount programs available to help make your medication more affordable. This can be less expensive than what you would pay with insurance.

## 2020-08-17 NOTE — ED Provider Notes (Signed)
HPI  SUBJECTIVE:  Rachael Gilbert is a 19 y.o. female who presents with fevers to 100, body, headaches, sore throat, cough starting yesterday.  No nasal congestion or rhinorrhea, postnasal drip, loss of sense of smell or taste, wheezing, chest pain, shortness of breath.  No nausea, vomiting, diarrhea, abdominal pain.  She was exposed to Covid on 12/25.  She did not get the flu or Covid vaccine.  No known flu exposure.  No antipyretic in the past 6 hours.  She tried Mucinex and pushing fluids.  Mucinex helps.  No aggravating factors.  She is concerned because she is a 66-month-old baby at home.  She is not breast-feeding.  She has a past medical history of asthma.  No history of diabetes, hypertension.  LMP: 4 weeks ago.  Denies the possibility of being pregnant.  PMD: none   Past Medical History:  Diagnosis Date  . Asthma   . Chlamydia infection   . UTI (urinary tract infection)   . Vitiligo     Past Surgical History:  Procedure Laterality Date  . NO PAST SURGERIES    . WISDOM TOOTH EXTRACTION      Family History  Problem Relation Age of Onset  . Bipolar disorder Mother   . Heart disease Mother   . Hypertension Mother   . Bipolar disorder Father     Social History   Tobacco Use  . Smoking status: Never Smoker  . Smokeless tobacco: Never Used  Vaping Use  . Vaping Use: Never used  Substance Use Topics  . Alcohol use: Not Currently  . Drug use: Not Currently    Types: Marijuana    Comment: last used in 2020    No current facility-administered medications for this encounter.  Current Outpatient Medications:  .  albuterol (VENTOLIN HFA) 108 (90 Base) MCG/ACT inhaler, Inhale 1-2 puffs into the lungs every 4 (four) hours as needed for wheezing or shortness of breath., Disp: 1 each, Rfl: 0 .  benzonatate (TESSALON) 200 MG capsule, Take 1 capsule (200 mg total) by mouth 3 (three) times daily as needed for cough., Disp: 30 capsule, Rfl: 0 .  ibuprofen (ADVIL) 600 MG tablet, Take  1 tablet (600 mg total) by mouth every 6 (six) hours as needed., Disp: 30 tablet, Rfl: 0 .  megestrol (MEGACE) 40 MG tablet, Take 1 tablet (40 mg total) by mouth 3 (three) times daily. Once bleeding slows taper off the medication by dropping to twice a day and then once a day and then stop., Disp: 60 tablet, Rfl: 1 .  Spacer/Aero-Holding Chambers (AEROCHAMBER PLUS) inhaler, Use with inhaler, Disp: 1 each, Rfl: 2 .  Blood Pressure Monitoring (BLOOD PRESSURE KIT) DEVI, 1 Device by Does not apply route as needed., Disp: 1 each, Rfl: 0 .  coconut oil OIL, Apply 1 application topically as needed., Disp: , Rfl: 0 .  Elastic Bandages & Supports (COMFORT FIT MATERNITY SUPP LG) MISC, 1 Units by Does not apply route daily as needed. (Patient not taking: No sig reported), Disp: 1 each, Rfl: 0 .  famotidine (PEPCID) 20 MG tablet, Take 1 tablet (20 mg total) by mouth 2 (two) times daily., Disp: 180 tablet, Rfl: 1 .  prenatal vitamin w/FE, FA (NATACHEW) 29-1 MG CHEW chewable tablet, Chew 1 tablet by mouth daily at 12 noon., Disp: 30 tablet, Rfl: 11  Allergies  Allergen Reactions  . 5-Alpha Reductase Inhibitors      ROS  As noted in HPI.   Physical Exam  BP 123/85 (BP Location: Left Arm)   Pulse (!) 109   Temp 100 F (37.8 C) (Oral)   Resp 20   LMP 07/20/2020   SpO2 97%   Constitutional: Well developed, well nourished, no acute distress Eyes:  EOMI, conjunctiva normal bilaterally HENT: Normocephalic, atraumatic,mucus membranes moist.  No nasal congestion.  Normal oropharynx. Neck: No cervical lymphadenopathy Respiratory: Normal inspiratory effort, lungs clear bilaterally Cardiovascular: Regular tachycardia, no murmurs, rubs or gallop GI: nondistended skin: No rash, skin intact Musculoskeletal: no deformities Neurologic: Alert & oriented x 3, no focal neuro deficits Psychiatric: Speech and behavior appropriate   ED Course   Medications - No data to display  Orders Placed This Encounter   Procedures  . Resp Panel by RT-PCR (Flu A&B, Covid) Nasopharyngeal Swab    Standing Status:   Standing    Number of Occurrences:   1    Order Specific Question:   Is this test for diagnosis or screening    Answer:   Diagnosis of ill patient    Order Specific Question:   Symptomatic for COVID-19 as defined by CDC    Answer:   Yes    Order Specific Question:   Date of Symptom Onset    Answer:   08/16/2020    Order Specific Question:   Hospitalized for COVID-19    Answer:   No    Order Specific Question:   Admitted to ICU for COVID-19    Answer:   No    Order Specific Question:   Previously tested for COVID-19    Answer:   No    Order Specific Question:   Resident in a congregate (group) care setting    Answer:   No    Order Specific Question:   Employed in healthcare setting    Answer:   No    Order Specific Question:   Pregnant    Answer:   No    Order Specific Question:   Has patient completed COVID vaccination(s) (2 doses of Pfizer/Moderna 1 dose of [employer])    Answer:   No   No results found for this or any previous visit (from the past 24 hour(s)).  ED Clinical Impression  1. COVID-19 virus infection   2. Viral illness   3. Encounter for laboratory testing for COVID-19 virus      ED Assessment/Plan  Patient borderline febrile, mild tachycardia.  Suspect Covid given known exposure.  Sending off Covid and flu.  She may be a candidate for monoclonal body infusion based on history of asthma, unvaccinated status.  Will send home with Tylenol/ibuprofen, albuterol inhaler with a spacer, continue Mucinex and Tessalon for cough.pulse ox if Covid positive, daily walking  Primary care list for ongoing care.  Patient states she has a MyChart account  Covid positive. Labs not crossing into chart.  Sent message to [facility] team for screening.   Discussed labs,  MDM, treatment plan, and plan for follow-up with patient. Discussed sn/sx that should prompt return to the ED. patient  agrees with plan.   Meds ordered this encounter  Medications  . albuterol (VENTOLIN HFA) 108 (90 Base) MCG/ACT inhaler    Sig: Inhale 1-2 puffs into the lungs every 4 (four) hours as needed for wheezing or shortness of breath.    Dispense:  1 each    Refill:  0  . Spacer/Aero-Holding Chambers (AEROCHAMBER PLUS) inhaler    Sig: Use with inhaler    Dispense:  1 each  Refill:  2    Please educate patient on use  . ibuprofen (ADVIL) 600 MG tablet    Sig: Take 1 tablet (600 mg total) by mouth every 6 (six) hours as needed.    Dispense:  30 tablet    Refill:  0  . benzonatate (TESSALON) 200 MG capsule    Sig: Take 1 capsule (200 mg total) by mouth 3 (three) times daily as needed for cough.    Dispense:  30 capsule    Refill:  0    *This clinic note was created using [employer]. Therefore, there may be occasional mistakes despite careful proofreading.   ?    Melynda Ripple, MD 08/19/20 [phone]

## 2020-08-17 NOTE — ED Triage Notes (Signed)
PT C/O: here for COVID testing.... reports her nephew tested positive for COVID and she was with him on 12/25  Reports she woke up yesterday w/sore throat, cough and body aches  DENIES: f/v/n/d  TAKING MEDS: Mucinex  A&O x4... NAD... Ambulatory

## 2021-07-01 IMAGING — US US OB < 14 WEEKS - US OB TV
1 series · 15 of 28 positions shown · non-contrast
Comparison: None.

CLINICAL DATA: 18-year-old pregnant female with pelvic pain. LMP:
07/14/2019 corresponding to an estimated gestational age of 4 weeks,
3 days.

EXAM:
OBSTETRIC <14 WK US AND TRANSVAGINAL OB US
TECHNIQUE: Both transabdominal and transvaginal ultrasound examinations were
performed for complete evaluation of the gestation as well as the
maternal uterus, adnexal regions, and pelvic cul-de-sac.
Transvaginal technique was performed to assess early pregnancy.

[Series 1: us ob < 14 weeks - us ob tv · 15 of 37 slices shown]
[im 1/37]
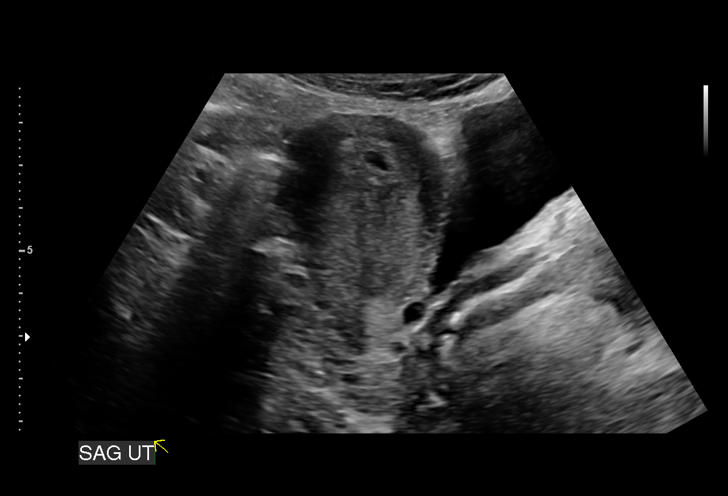
[im 3/37]
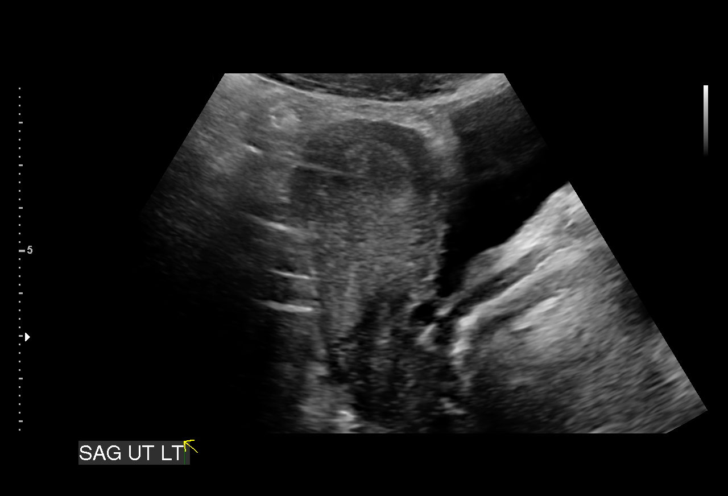
[im 6/37]
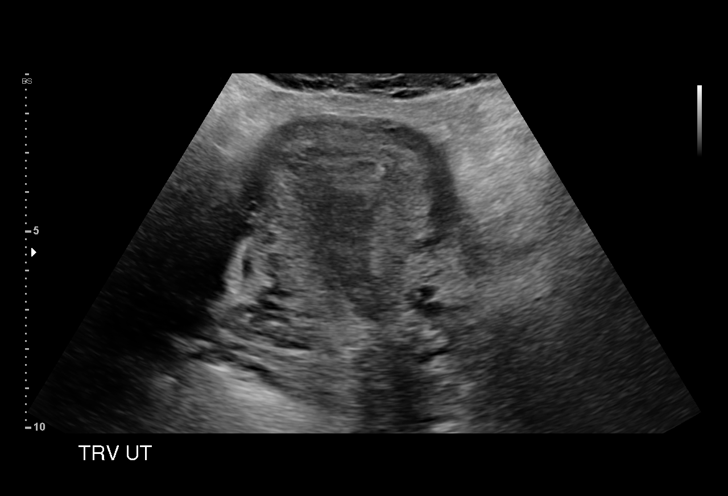
[im 9/37]
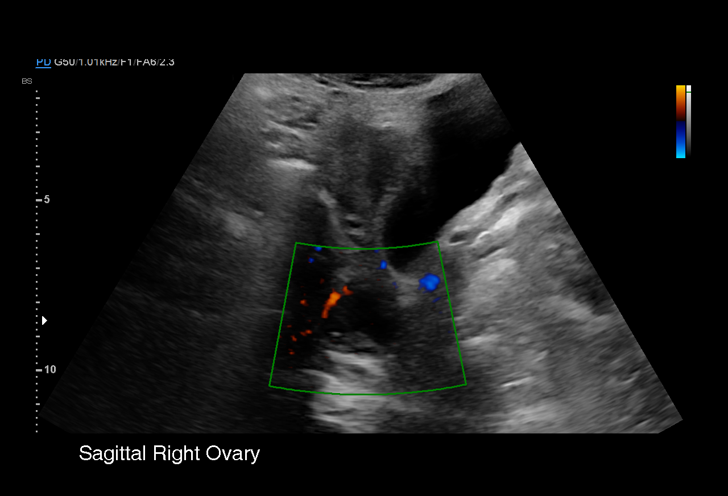
[im 11/37]
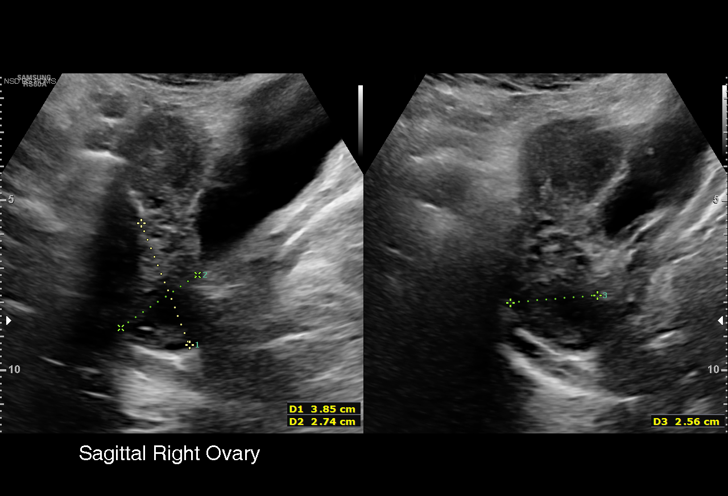
[im 14/37]
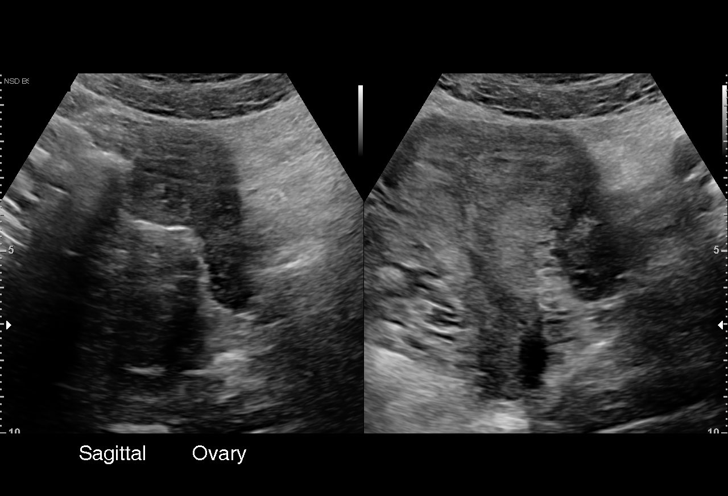
[im 17/37]
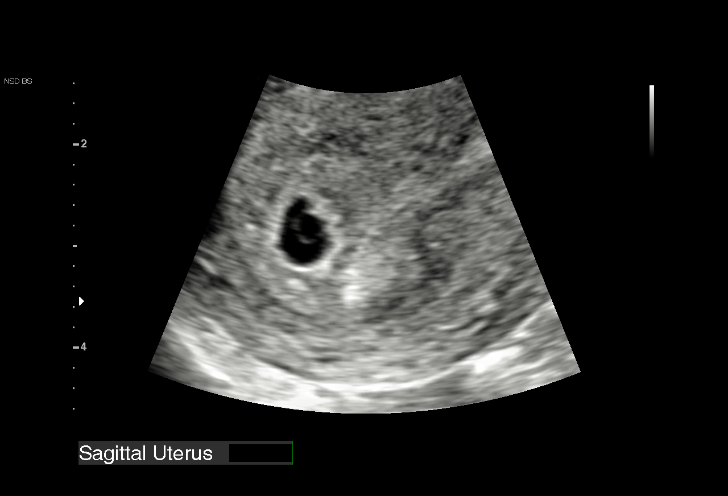
[im 19/37]
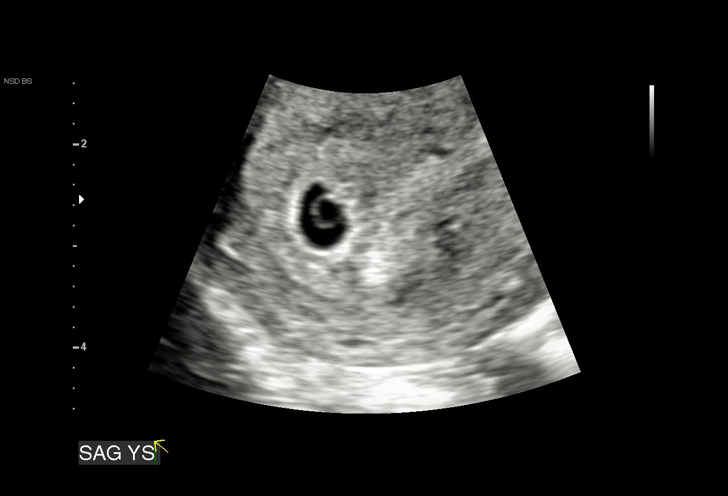
[im 21/37]
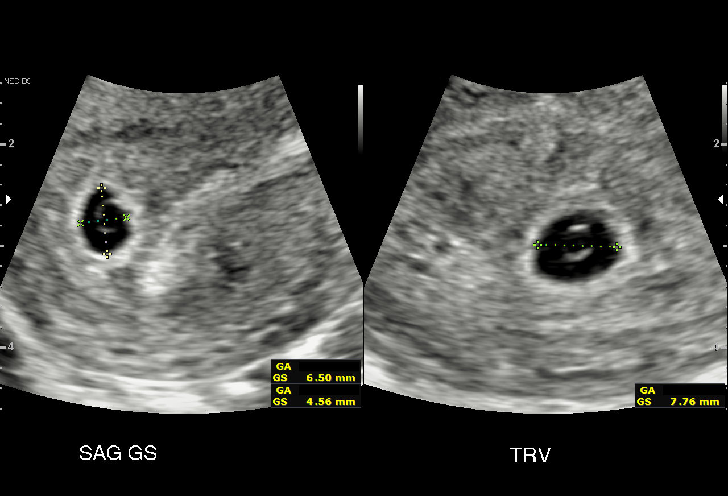
[im 23/37]
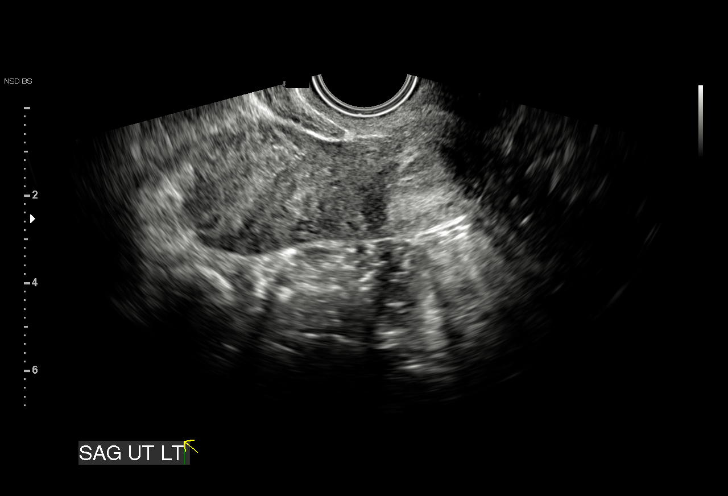
[im 26/37]
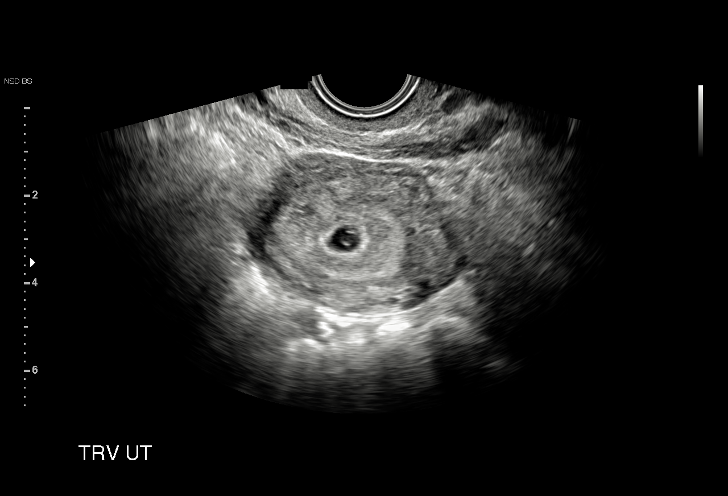
[im 29/37]
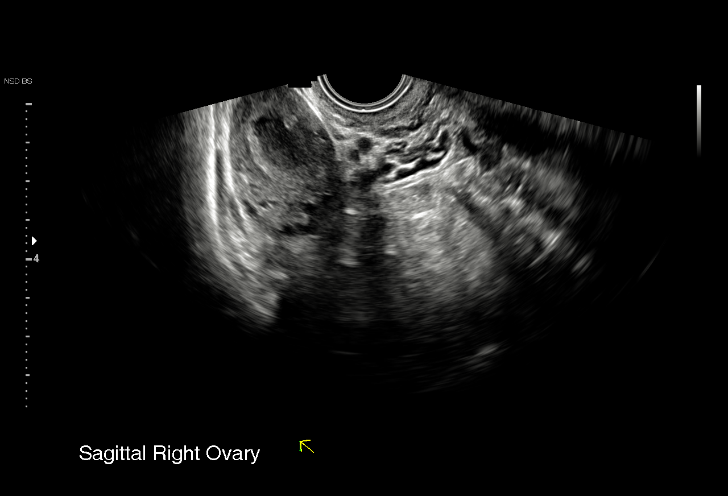
[im 31/37]
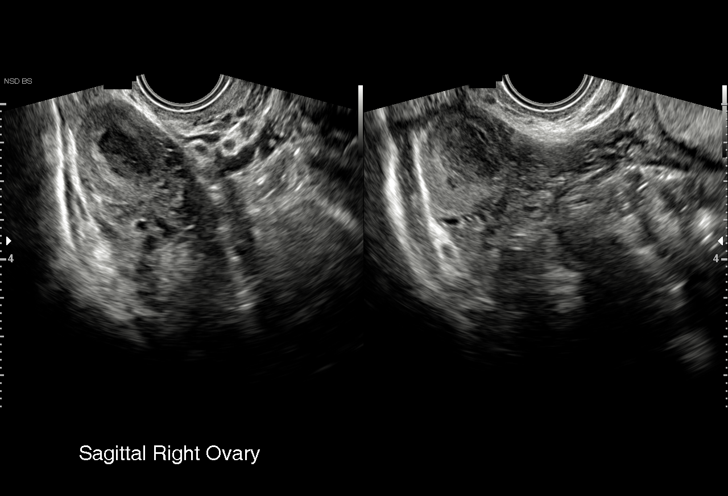
[im 34/37]
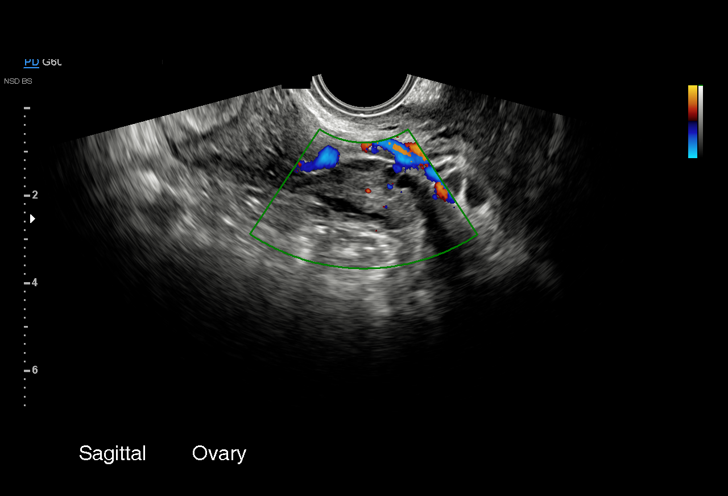
[im 37/37]
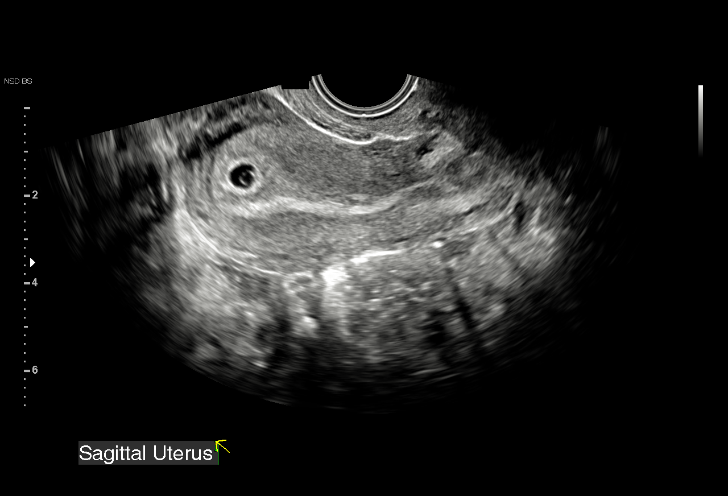

[15 of 28 positions shown; findings below may reference images not displayed]

FINDINGS: Intrauterine gestational sac: Single intrauterine gestational sac.

Yolk sac:  Seen

Embryo:  Not present.

Cardiac Activity: N/A

Heart Rate: N/A bpm

MSD: 6 mm   5 w   2 d

Subchorionic hemorrhage:  None visualized.

Maternal uterus/adnexae: The maternal ovaries are unremarkable.
There is a corpus luteum in the right ovary.
IMPRESSION: Single intrauterine gestational sac with an estimated gestational
age of 5 weeks, 2 days. No fetal pole identified at this time.
Follow-up with ultrasound in 7-11 days, or earlier if clinically
indicated, recommended.

## 2021-11-06 IMAGING — US US MFM OB FOLLOW-UP
1 series · 13 of 28 positions shown · non-contrast
Comparison: none

[Series 1: us mfm ob follow-up · 13 of 66 slices shown]
[im 3/66]
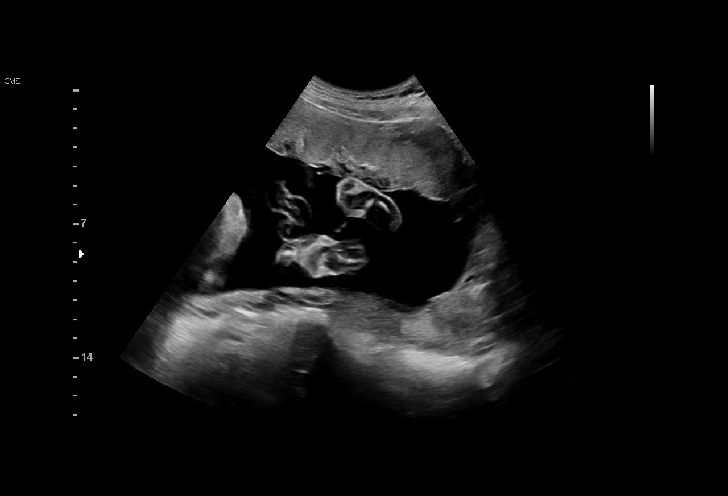
[im 8/66]
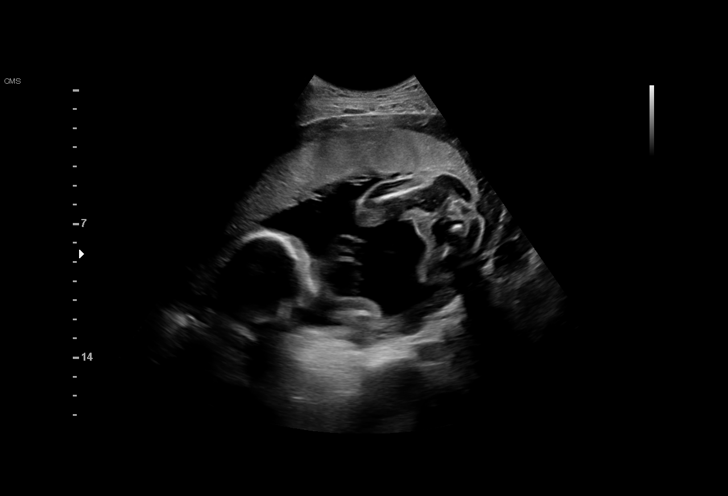
[im 13/66]
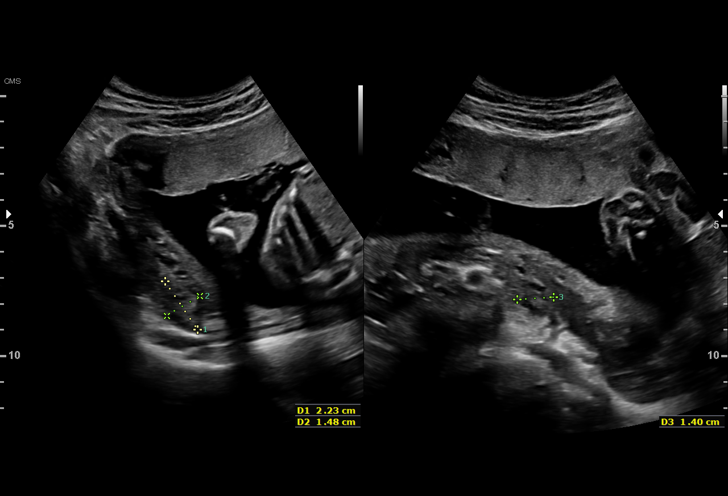
[im 17/66]
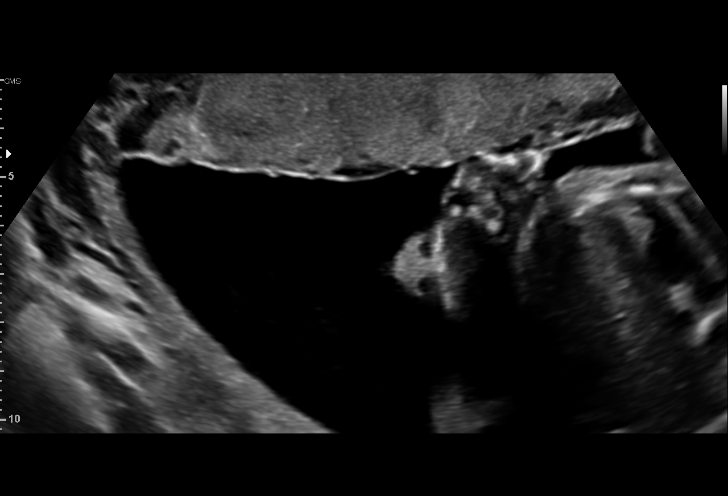
[im 22/66]
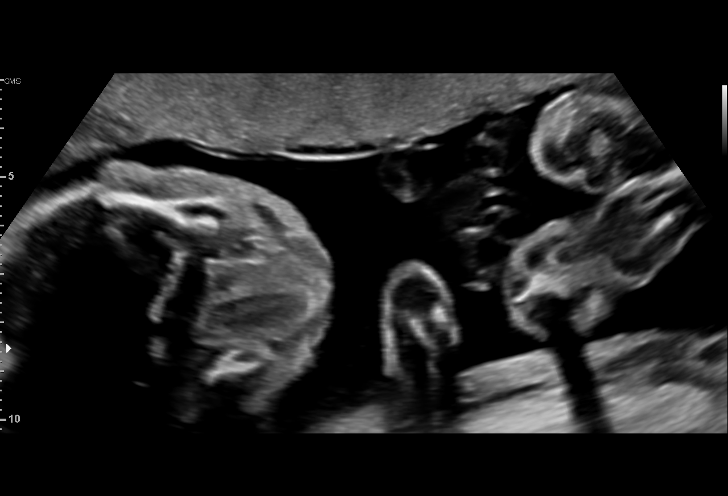
[im 27/66]
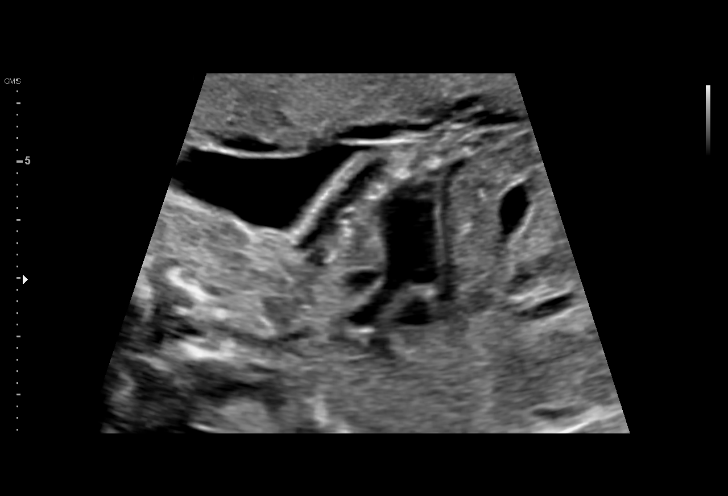
[im 34/66]
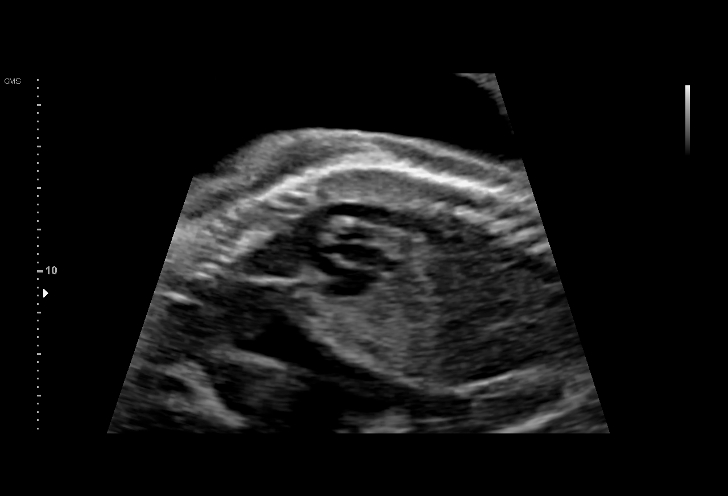
[im 39/66]
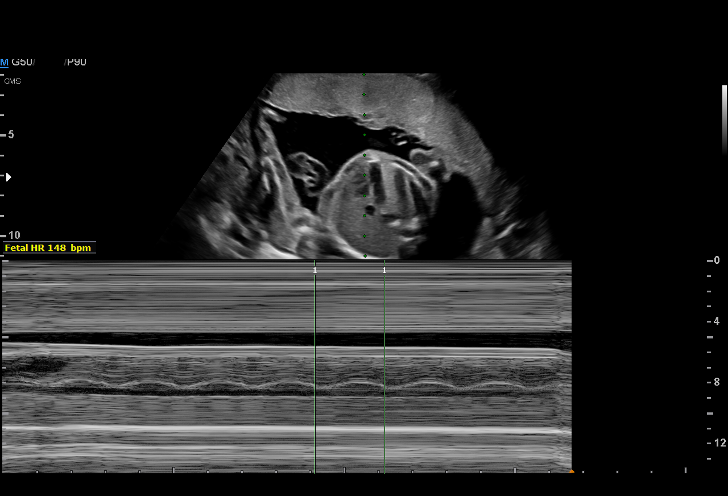
[im 44/66]
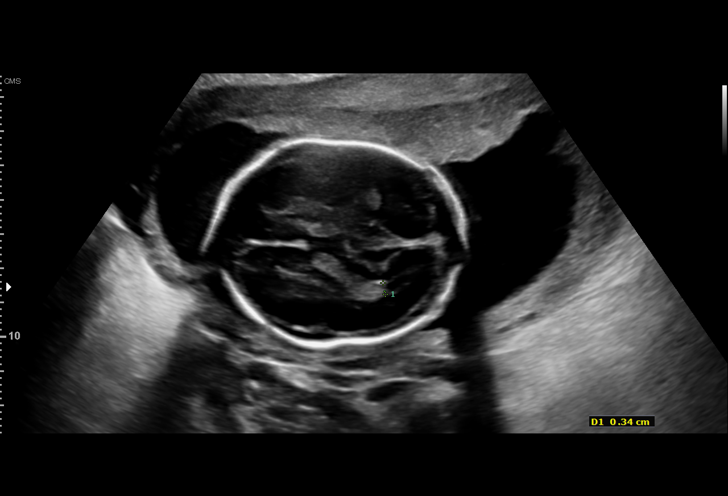
[im 49/66]
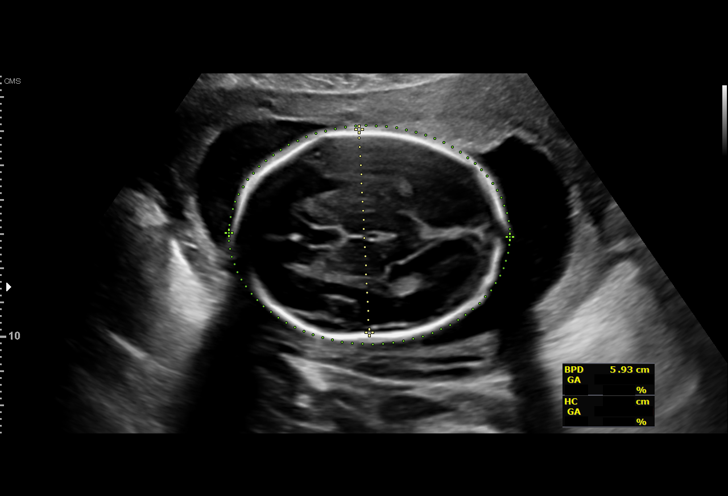
[im 53/66]
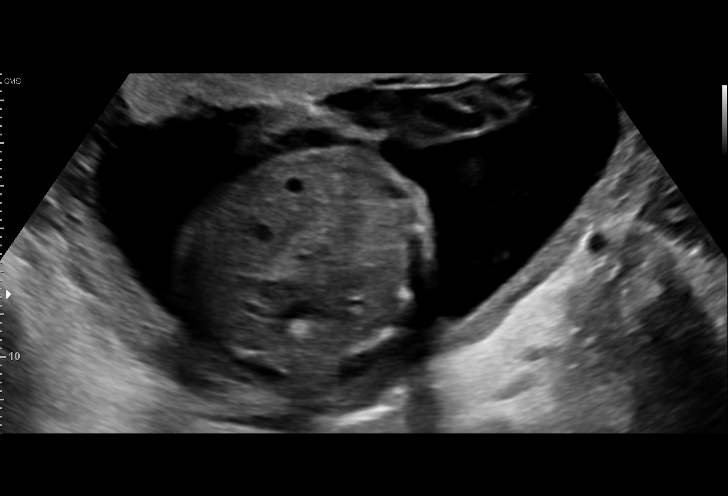
[im 58/66]
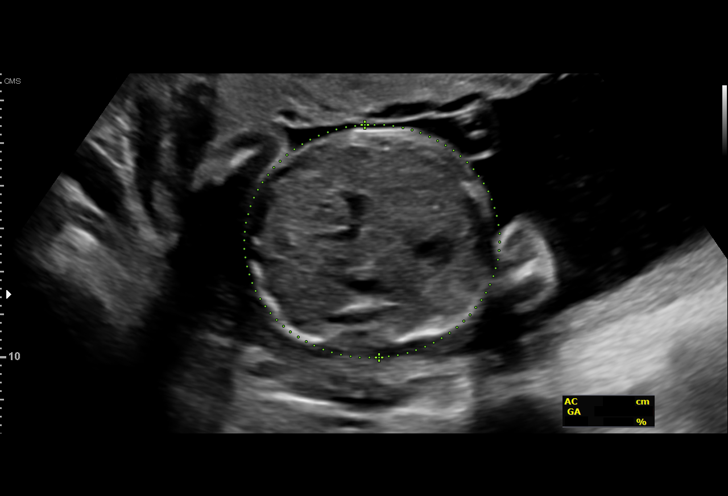
[im 63/66]
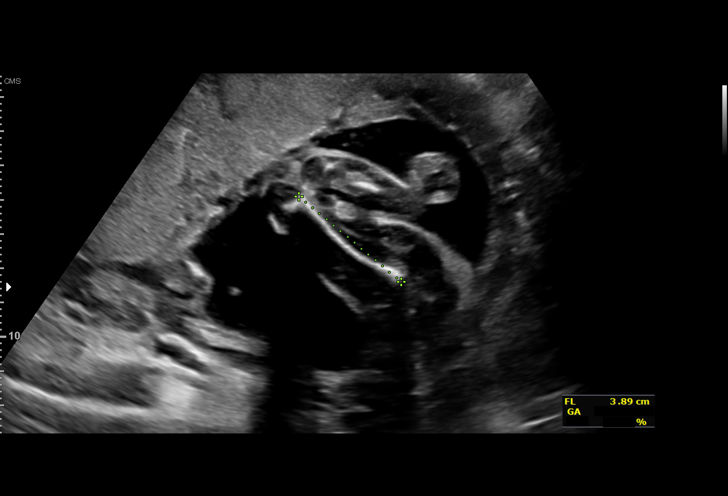

[13 of 28 positions shown; findings below may reference images not displayed]

[address]

Indications

 23 weeks gestation of pregnancy
 Antenatal follow-up for nonvisualized fetal
 anatomy
Fetal Evaluation

 Num Of Fetuses:          1
 Fetal Heart Rate(bpm):   148
 Cardiac Activity:        Observed
 Presentation:            Breech
 Placenta:                Anterior
 P. Cord Insertion:       Visualized

 Amniotic Fluid
 AFI FV:      Within normal limits

                             Largest Pocket(cm)

Biometry

 BPD:      58.8  mm     G. Age:  24w 0d         68  %    CI:        68.19   %    70 - 86
                                                         FL/HC:       17.0  %    19.2 -
 HC:      227.7  mm     G. Age:  24w 6d         84  %    HC/AC:       1.27       1.05 -
 AC:      179.4  mm     G. Age:  22w 6d         23  %    FL/BPD:      65.6  %    71 - 87
 FL:       38.6  mm     G. Age:  22w 3d         11  %    FL/AC:       21.5  %    20 - 24
 HUM:      38.4  mm     G. Age:  23w 4d         46  %

 Est. FW:     543   gm     1 lb 3 oz     19  %
OB History

 Blood Type:   B+
 Gravidity:    1         Term:   0        Prem:   0        SAB:   0
 TOP:          0       Ectopic:  0        Living: 0
Gestational Age

 LMP:           22w 5d        Date:  07/14/19                 EDD:   04/19/20
 U/S Today:     23w 4d                                        EDD:   04/13/20
 Best:          23w 3d     Det. By:  Early Ultrasound         EDD:   04/14/20
                                     (08/25/19)
Anatomy

 Cranium:               Appears normal         Aortic Arch:            Appears normal
 Cavum:                 Appears normal         Ductal Arch:            Appears normal
 Ventricles:            Appears normal         Diaphragm:              Appears normal
 Choroid Plexus:        Appears normal         Stomach:                Appears normal, left
                                                                       sided
 Cerebellum:            Appears normal         Abdomen:                Appears normal
 Posterior Fossa:       Appears normal         Abdominal Wall:         Appears nml (cord
                                                                       insert, abd wall)
 Nuchal Fold:           Previously seen        Cord Vessels:           Appears normal (3
                                                                       vessel cord)
 Face:                  Appears normal         Kidneys:                Appear normal
                        (orbits and profile)
 Lips:                  Appears normal         Bladder:                Appears normal
 Thoracic:              Appears normal         Spine:                  Previously seen
 Heart:                 Appears normal         Upper Extremities:      Previously seen
                        (4CH, axis, and
                        situs)
 RVOT:                  Appears normal         Lower Extremities:      Previously seen
 LVOT:                  Appears normal

 Other:  Heels and Rt 5th digit previously seen.  Technically difficult due to
         fetal position.
Cervix Uterus Adnexa

 Cervix
 Length:              4  cm.
 Normal appearance by transabdominal scan.

 Uterus
 No abnormality visualized.

 Right Ovary
 Within normal limits.

 Left Ovary
 Within normal limits.

 Cul De Sac
 No free fluid seen.

 Adnexa
 No abnormality visualized.
Impression

 Follow up anatomy due to prior suboptimal imaging.
 Today we observe a normal interval growth and a completed
 anatomy examination
 There is good fetal movement and amniotic fluid.
Recommendations

 Follow up as clincally indicated.

## 2022-03-09 ENCOUNTER — Telehealth: Payer: Medicaid Other | Admitting: Nurse Practitioner

## 2022-03-09 DIAGNOSIS — B3731 Acute candidiasis of vulva and vagina: Secondary | ICD-10-CM | POA: Diagnosis not present

## 2022-03-09 MED ORDER — FLUCONAZOLE 150 MG PO TABS: 150.0000 mg | ORAL_TABLET | Freq: Once | ORAL | 0 refills | Status: AC

## 2022-03-09 NOTE — Progress Notes (Signed)

## 2022-03-09 NOTE — Progress Notes (Signed)
I have spent 5 minutes in review of e-visit questionnaire, review and updating patient chart, medical decision making and response to patient.  ° °Xiara Knisley W Shonia Skilling, NP ° °  °

## 2023-04-06 ENCOUNTER — Ambulatory Visit (INDEPENDENT_AMBULATORY_CARE_PROVIDER_SITE_OTHER): Payer: Medicaid Other | Admitting: Family Medicine

## 2023-04-06 ENCOUNTER — Other Ambulatory Visit: Payer: Self-pay

## 2023-04-06 ENCOUNTER — Encounter: Payer: Self-pay | Admitting: Family Medicine

## 2023-04-06 ENCOUNTER — Other Ambulatory Visit (HOSPITAL_COMMUNITY)
Admission: RE | Admit: 2023-04-06 | Discharge: 2023-04-06 | Disposition: A | Discharge status: Home / Self Care | Payer: Medicaid Other | Source: Ambulatory Visit | Attending: Family Medicine | Admitting: Family Medicine

## 2023-04-06 VITALS — BP 114/78 | HR 80 | Wt 139.9 lb

## 2023-04-06 DIAGNOSIS — Z124 Encounter for screening for malignant neoplasm of cervix: Secondary | ICD-10-CM

## 2023-04-06 DIAGNOSIS — J4520 Mild intermittent asthma, uncomplicated: Secondary | ICD-10-CM | POA: Diagnosis not present

## 2023-04-06 DIAGNOSIS — Z113 Encounter for screening for infections with a predominantly sexual mode of transmission: Secondary | ICD-10-CM | POA: Diagnosis not present

## 2023-04-06 DIAGNOSIS — Z01419 Encounter for gynecological examination (general) (routine) without abnormal findings: Secondary | ICD-10-CM | POA: Diagnosis not present

## 2023-04-06 DIAGNOSIS — Z3046 Encounter for surveillance of implantable subdermal contraceptive: Secondary | ICD-10-CM

## 2023-04-06 MED ORDER — ETONOGESTREL 68 MG ~~LOC~~ IMPL
68.0000 mg | DRUG_IMPLANT | Freq: Once | SUBCUTANEOUS | Status: AC
Start: 2023-04-06 — End: 2023-04-06
  Administered 2023-04-06: 68 mg via SUBCUTANEOUS

## 2023-04-06 MED ORDER — ALBUTEROL SULFATE HFA 108 (90 BASE) MCG/ACT IN AERS
1.0000 | INHALATION_SPRAY | RESPIRATORY_TRACT | 0 refills | Status: AC | PRN
Start: 2023-04-06 — End: ?

## 2023-04-06 NOTE — Patient Instructions (Signed)

## 2023-04-06 NOTE — Progress Notes (Signed)
Subjective:     Rachael Gilbert is a 22 y.o. female and is here for a comprehensive physical exam. The patient reports problems - some dysmenorrhea. Reports normal cycles with Nexplanon in place. Not tracking her periods.  The following portions of the patient's history were reviewed and updated as appropriate: allergies, current medications, past family history, past medical history, past social history, past surgical history, and problem list.  Review of Systems Pertinent items noted in HPI and remainder of comprehensive ROS otherwise negative.   Objective:  Chaperone present for exam   BP 114/78   Pulse 80   Wt 139 lb 14.4 oz (63.5 kg)   LMP 03/23/2023 (Approximate)   SpO2 100%   BMI 24.78 kg/m  General appearance: alert, cooperative, and appears stated age Head: Normocephalic, without obvious abnormality, atraumatic Neck: no adenopathy, supple, symmetrical, trachea midline, and thyroid not enlarged, symmetric, no tenderness/mass/nodules Lungs: clear to auscultation bilaterally Breasts: normal appearance, no masses or tenderness Heart: regular rate and rhythm Abdomen: soft, non-tender; bowel sounds normal; no masses,  no organomegaly Pelvic: cervix normal in appearance, external genitalia normal, no adnexal masses or tenderness, no cervical motion tenderness, uterus normal size, shape, and consistency, and vagina normal without discharge Extremities: extremities normal, atraumatic, no cyanosis or edema Pulses: 2+ and symmetric Skin: Skin color, texture, turgor normal. No rashes or lesions Lymph nodes: Cervical, supraclavicular, and axillary nodes normal. Neurologic: Grossly normal    Procedure: Patient given informed consent for removal of her Nexplanon, time out was performed.  Signed copy in the chart.  Appropriate time out taken. Nexplanon site identified.  Area prepped in usual sterile fashon. Six cc of 1% lidocaine was used to anesthetize the area at the distal end of the  implant and the line of the device for planned re-insertion. A small stab incision was made right beside the implant on the distal portion.  The Nexplanon rod was grasped using hemostats and removed without difficulty.  There was less than 3 cc blood loss. There were no complications.   Procedure: Nexplanon removed form packaging,  Device confirmed in needle, then inserted full length of needle and withdrawn per handbook instructions.    Steri-strips were applied over the small incision.  A pressure bandage was applied to reduce any bruising.  The patient tolerated the procedure well and was given post procedure instructions.   Assessment:    Healthy female exam.      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, mild intermittent, uncomplicated    Refilled her inhaler      Relevant Medications   albuterol (VENTOLIN HFA) 108 (90 Base) MCG/ACT inhaler   Other Visit Diagnoses     Encounter for gynecological examination without abnormal finding    -  Primary   Screening examination for STD (sexually transmitted disease)       Relevant Orders   HIV antibody (with reflex)   Hepatitis B Surface AntiGEN   Hepatitis C Antibody   RPR   Papanicolaou smear for cervical cancer screening       Relevant Orders   Cytology - PAP( [facility])   Encounter for removal and reinsertion of Nexplanon       Relevant Medications   etonogestrel (NEXPLANON) implant 68 mg (Completed)      Return in 1 year (on 04/05/2024).    See After Visit Summary for Counseling Recommendations

## 2023-04-06 NOTE — Assessment & Plan Note (Signed)
Refilled her inhaler

## 2023-04-07 LAB — RPR: RPR Ser Ql: NONREACTIVE

## 2023-04-07 LAB — HEPATITIS C ANTIBODY: Hep C Virus Ab: NONREACTIVE

## 2023-04-07 LAB — HEPATITIS B SURFACE ANTIGEN: Hepatitis B Surface Ag: NEGATIVE

## 2023-04-07 LAB — HIV ANTIBODY (ROUTINE TESTING W REFLEX): HIV Screen 4th Generation wRfx: NONREACTIVE

## 2023-04-08 LAB — CYTOLOGY - PAP
Chlamydia: NEGATIVE
Comment: NEGATIVE
Comment: NEGATIVE
Comment: NORMAL
Diagnosis: NEGATIVE
Neisseria Gonorrhea: NEGATIVE
Trichomonas: NEGATIVE

## 2023-12-24 ENCOUNTER — Ambulatory Visit: Admitting: Family Medicine

## 2023-12-24 ENCOUNTER — Other Ambulatory Visit: Payer: Self-pay

## 2023-12-24 ENCOUNTER — Encounter: Payer: Self-pay | Admitting: Family Medicine

## 2023-12-24 VITALS — BP 113/82 | HR 77 | Wt 153.4 lb

## 2023-12-24 DIAGNOSIS — N921 Excessive and frequent menstruation with irregular cycle: Secondary | ICD-10-CM

## 2023-12-24 DIAGNOSIS — Z975 Presence of (intrauterine) contraceptive device: Secondary | ICD-10-CM

## 2023-12-24 MED ORDER — CELECOXIB 200 MG PO CAPS: 200.0000 mg | ORAL_CAPSULE | Freq: Two times a day (BID) | ORAL | 0 refills | Status: AC

## 2023-12-24 NOTE — Progress Notes (Signed)
   Subjective:    Patient ID: Rachael Gilbert is a 23 y.o. female presenting with Bleeding with Nexplanon   (-Bleeding with nexplanon  placed on 03/2023. Bleeding every two weeks. )  on 12/24/2023  HPI: Nexplanon  inserted 03/2023.She previously had same and removed for bleeding. She does not desire pregnancy right now. Reports some mood issues with easily being angered.  Review of Systems  Constitutional:  Negative for chills and fever.  Respiratory:  Negative for shortness of breath.   Cardiovascular:  Negative for chest pain.  Gastrointestinal:  Negative for abdominal pain, nausea and vomiting.  Genitourinary:  Negative for dysuria.  Skin:  Negative for rash.      Objective:    BP 113/82   Pulse 77   Wt 153 lb 6.4 oz (69.6 kg)   BMI 27.17 kg/m  Physical Exam Exam conducted with a chaperone present.  Constitutional:      General: She is not in acute distress.    Appearance: She is well-developed.  HENT:     Head: Normocephalic and atraumatic.  Eyes:     General: No scleral icterus. Cardiovascular:     Rate and Rhythm: Normal rate.  Pulmonary:     Effort: Pulmonary effort is normal.  Abdominal:     Palpations: Abdomen is soft.  Musculoskeletal:     Cervical back: Neck supple.  Skin:    General: Skin is warm and dry.  Neurological:     Mental Status: She is alert and oriented to person, place, and time.         Assessment & Plan:   Breakthrough bleeding on Nexplanon  - discused options, change to other method. Not good with pills, desires LARC. Trial of NSAIDs and consider change to IUD (Mirena) - Plan: celecoxib (CELEBREX) 200 MG capsule  Return in about 2 months (around 02/23/2024) for a follow-up.  Granville Layer, MD 12/24/2023 10:33 AM

## 2023-12-24 NOTE — Progress Notes (Deleted)
 error
